# Patient Record
Sex: Female | Born: 1965 | Race: Black or African American | Hispanic: No | Marital: Married | Smoking: Never smoker
Health system: Southern US, Community
[De-identification: ages and names within clinical notes are randomized; demographics above are authoritative.]

## PROBLEM LIST (undated history)

## (undated) DIAGNOSIS — R7303 Prediabetes: Secondary | ICD-10-CM

## (undated) DIAGNOSIS — I10 Essential (primary) hypertension: Secondary | ICD-10-CM

## (undated) DIAGNOSIS — E119 Type 2 diabetes mellitus without complications: Secondary | ICD-10-CM

## (undated) DIAGNOSIS — C50919 Malignant neoplasm of unspecified site of unspecified female breast: Secondary | ICD-10-CM

## (undated) DIAGNOSIS — J302 Other seasonal allergic rhinitis: Secondary | ICD-10-CM

## (undated) DIAGNOSIS — T7840XA Allergy, unspecified, initial encounter: Secondary | ICD-10-CM

## (undated) DIAGNOSIS — M1712 Unilateral primary osteoarthritis, left knee: Secondary | ICD-10-CM

## (undated) HISTORY — DX: Malignant neoplasm of unspecified site of unspecified female breast: C50.919

## (undated) HISTORY — DX: Type 2 diabetes mellitus without complications: E11.9

## (undated) HISTORY — DX: Allergy, unspecified, initial encounter: T78.40XA

## (undated) NOTE — *Deleted (*Deleted)
PACU TO INPATIENT HANDOFF REPORT  Name/Age/Gender Harland German [place] 49 y.o. female  Code Status    Code Status Orders  (From admission, onward)         Start     Ordered   03/07/20 1703  Full code  Continuous        03/07/20 1705        Code Status History    This patient has a current code status but no historical code status.   Advance Care Planning Activity      Home/SNF/Other {Discharge Destination:18313::"Home"}  Chief Complaint Breast cancer (HCC) [C50.919]  Level of Care/Admitting Diagnosis ED Disposition    None      Medical History Past Medical History:  Diagnosis Date  . Arthritis of knee, left   . Breast cancer (HCC)   . Family history of pancreatic cancer 01/12/2020  . Hypertension   . Pre-diabetes    per patient  . Seasonal allergies     Allergies No Known Allergies  IV Location/Drains/Wounds Patient Lines/Drains/Airways Status    Active Line/Drains/Airways    Name Placement date Placement time Site Days   Closed System Drain 1 Lateral;Left;Superior Breast Bulb (JP) 19 Fr. 03/07/20  1524  Breast  less than 1   Closed System Drain 1 Right Breast Bulb (JP) 19 Fr. 03/07/20  1555  Breast  less than 1   Incision (Closed) 03/07/20 Breast Right 03/07/20  1615   less than 1   Incision (Closed) 03/07/20 Breast Left 03/07/20  1615   less than 1   Incision (Closed) 03/07/20 Axilla Right 03/07/20  1626   less than 1          Labs/Imaging Results for orders placed or performed during the hospital encounter of 03/07/20 (from the past 48 hour(s))  Glucose, capillary     Status: Abnormal   Collection Time: 03/07/20 11:17 AM  Result Value Ref Range   Glucose-Capillary 111 (H) 70 - 99 mg/dL    Comment: Glucose reference range applies only to samples taken after fasting for at least 8 hours.   Comment 1 Notify RN   Glucose, capillary     Status: Abnormal   Collection Time: 03/07/20  5:21 PM  Result Value Ref Range   Glucose-Capillary 127 (H) 70  - 99 mg/dL    Comment: Glucose reference range applies only to samples taken after fasting for at least 8 hours.   NM Sentinel Node Inj-No Rpt (Breast)  Result Date: 03/07/2020 Sulfur colloid was injected by the nuclear medicine technologist for melanoma sentinel node.    Pending Labs   Vitals/Pain Today's Vitals   03/07/20 1745 03/07/20 1800 03/07/20 1815 03/07/20 1900  BP: 120/70 121/78 127/82 120/72  Pulse: 83 74 75 70  Resp: (!) 21 19 20 18   Temp:    98.3 F (36.8 C)  TempSrc:      SpO2: 96% 96% 97% 98%  PainSc:  Asleep  3     Isolation Precautions @ISOLATION @  Administered Medications Periop Administered Meds from 03/07/2020 1103 to 03/07/2020 2037      Date/Time Order Dose Route Action Action by Comments    03/07/2020 1425 0.9 % irrigation (POUR BTL) 1,000 mL Irrigation Given Chevis Pretty III, MD expires 07/24    03/07/2020 1151 acetaminophen (TYLENOL) tablet 1,000 mg 1,000 mg Oral Given Patsi Sears, RN     03/07/2020 1428 albumin human 5 % solution 0  Intravenous Lynford Humphrey, CRNA     03/07/2020 1415 albumin  human 5 % solution   Intravenous New Bag/Given Dairl Ponder, CRNA     03/07/2020 1426 bupivacaine (PF) (MARCAINE) 0.25 % injection 16 mL Infiltration Given Chevis Pretty III, MD exp 08/2023    03/07/2020 1151 celecoxib (CELEBREX) capsule 200 mg 200 mg Oral Given Patsi Sears, RN     03/07/2020 1152 chlorhexidine (PERIDEX) 0.12 % solution 15 mL 15 mL Mouth/Throat Given Patsi Sears, RN     03/07/2020 1415 dexamethasone (DECADRON) injection 10 mg Intravenous Given Dairl Ponder, CRNA     03/07/2020 1643 fentaNYL citrate (PF) (SUBLIMAZE) injection 25 mcg Intravenous Given Dairl Ponder, CRNA     03/07/2020 1534 fentaNYL citrate (PF) (SUBLIMAZE) injection 50 mcg Intravenous Given Dairl Ponder, CRNA     03/07/2020 1526 fentaNYL citrate (PF) (SUBLIMAZE) injection 50 mcg Intravenous Given Dairl Ponder, CRNA     03/07/2020 1439 fentaNYL citrate (PF)  (SUBLIMAZE) injection 50 mcg Intravenous Given Dairl Ponder, CRNA     03/07/2020 1413 fentaNYL citrate (PF) (SUBLIMAZE) injection 50 mcg Intravenous Given Dairl Ponder, CRNA     03/07/2020 1334 fentaNYL citrate (PF) (SUBLIMAZE) injection 50 mcg Intravenous Given Dairl Ponder, CRNA     03/07/2020 1151 gabapentin (NEURONTIN) capsule 300 mg 300 mg Oral Given Patsi Sears, RN     03/07/2020 1725 HYDROmorphone (DILAUDID) injection 0.25-0.5 mg 0.5 mg Intravenous Given Wynelle Link, RN     03/07/2020 1632 ketorolac (TORADOL) 30 MG/ML injection 30 mg Intravenous Given Dairl Ponder, CRNA     03/07/2020 1659 lactated ringers infusion   Intravenous New Bag/Given Dairl Ponder, CRNA     03/07/2020 1335 lactated ringers infusion   Intravenous Restarted Dorris Singh, MD     03/07/2020 1334 lactated ringers infusion   Intravenous Coralyn Pear, MD Switch to gravity    03/07/2020 1210 lactated ringers infusion   Intravenous [address] Patsi Sears, RN     03/07/2020 1342 lidocaine 2% (20 mg/mL) 5 mL syringe 80 mg Intravenous Given Dairl Ponder, CRNA     03/07/2020 1615 lidocaine-EPINEPHrine (XYLOCAINE W/EPI) 1 %-1:100000 (with pres) injection 12 mL  Given Peggye Form, DO exp 2024    03/07/2020 1152 MEDLINE mouth rinse   Mouth Rinse See Alternative Patsi Sears, RN     03/07/2020 1334 midazolam (VERSED) injection 2 mg Intravenous Given Dairl Ponder, CRNA     03/07/2020 1632 ondansetron (ZOFRAN) injection 4 mg Intravenous Given Dairl Ponder, CRNA     03/07/2020 1650 phenylephrine (NEOSYNEPHRINE) 10-0.9 MG/250ML-% infusion 0 mcg/min Intravenous Lynford Humphrey, CRNA     03/07/2020 1534 phenylephrine (NEOSYNEPHRINE) 10-0.9 MG/250ML-% infusion 15 mcg/min Intravenous Rate/Dose Change Dairl Ponder, CRNA     03/07/2020 1509 phenylephrine (NEOSYNEPHRINE) 10-0.9 MG/250ML-% infusion 25 mcg/min Intravenous Rate/Dose Change Dairl Ponder, CRNA     03/07/2020 1503 phenylephrine  (NEOSYNEPHRINE) 10-0.9 MG/250ML-% infusion 15 mcg/min Intravenous Rate/Dose Change Dairl Ponder, CRNA     03/07/2020 1500 phenylephrine (NEOSYNEPHRINE) 10-0.9 MG/250ML-% infusion 10 mcg/min Intravenous Rate/Dose Change Dairl Ponder, CRNA     03/07/2020 1454 phenylephrine (NEOSYNEPHRINE) 10-0.9 MG/250ML-% infusion 20 mcg/min Intravenous Rate/Dose Change Dairl Ponder, CRNA     03/07/2020 1451 phenylephrine (NEOSYNEPHRINE) 10-0.9 MG/250ML-% infusion 30 mcg/min Intravenous Rate/Dose Change Dairl Ponder, CRNA     03/07/2020 1442 phenylephrine (NEOSYNEPHRINE) 10-0.9 MG/250ML-% infusion 40 mcg/min Intravenous Rate/Dose Change Dairl Ponder, CRNA     03/07/2020 1431 phenylephrine (NEOSYNEPHRINE) 10-0.9 MG/250ML-% infusion 60 mcg/min Intravenous Canceled Entry Dairl Ponder, CRNA     03/07/2020 1412 phenylephrine (NEOSYNEPHRINE) 10-0.9 MG/250ML-% infusion 50 mcg/min Intravenous Rate/Dose Change Tylene Fantasia,  Mickie Hillier, CRNA     03/07/2020 1404 phenylephrine (NEOSYNEPHRINE) 10-0.9 MG/250ML-% infusion 35 mcg/min Intravenous Rate/Dose Change Dairl Ponder, CRNA     03/07/2020 1356 phenylephrine (NEOSYNEPHRINE) 10-0.9 MG/250ML-% infusion 25 mcg/min Intravenous New Bag/Given Dairl Ponder, CRNA     03/07/2020 1342 propofol (DIPRIVAN) 10 mg/mL bolus/IV push 150 mg Intravenous Given Dairl Ponder, CRNA     03/07/2020 1343 rocuronium bromide 10 mg/mL (PF) syringe 80 mg Intravenous Given Dairl Ponder, CRNA     03/07/2020 1151 scopolamine (TRANSDERM-SCOP) 1 MG/3DAYS 1.5 mg 1.5 mg Transdermal Patch Applied Patsi Sears, RN     03/07/2020 1648 sugammadex sodium (BRIDION) injection 200 mg Intravenous Given Dairl Ponder, CRNA     03/07/2020 1414 technetium sulfur colloid (NYCOMED-Pryor) filtered injection solution 1 millicurie 1 millicurie Intradermal Contrast Given Stedge, Shanin B       Mobility {Mobility:20148}

---

## 1998-12-27 ENCOUNTER — Other Ambulatory Visit: Admission: RE | Admit: 1998-12-27 | Discharge: 1998-12-27 | Payer: Self-pay | Admitting: *Deleted

## 1999-02-27 ENCOUNTER — Encounter: Payer: Self-pay | Admitting: Family Medicine

## 1999-02-27 ENCOUNTER — Ambulatory Visit (HOSPITAL_COMMUNITY): Admission: RE | Admit: 1999-02-27 | Discharge: 1999-02-27 | Payer: Self-pay | Admitting: Family Medicine

## 2003-03-24 ENCOUNTER — Emergency Department (HOSPITAL_COMMUNITY): Admission: EM | Admit: 2003-03-24 | Discharge: 2003-03-24 | Payer: Self-pay | Admitting: Emergency Medicine

## 2003-07-17 ENCOUNTER — Other Ambulatory Visit: Admission: RE | Admit: 2003-07-17 | Discharge: 2003-07-17 | Payer: Self-pay | Admitting: Family Medicine

## 2004-07-18 ENCOUNTER — Other Ambulatory Visit: Admission: RE | Admit: 2004-07-18 | Discharge: 2004-07-18 | Payer: Self-pay | Admitting: Family Medicine

## 2005-10-24 ENCOUNTER — Other Ambulatory Visit: Admission: RE | Admit: 2005-10-24 | Discharge: 2005-10-24 | Payer: Self-pay | Admitting: Family Medicine

## 2007-03-25 ENCOUNTER — Other Ambulatory Visit: Admission: RE | Admit: 2007-03-25 | Discharge: 2007-03-25 | Payer: Self-pay | Admitting: Family Medicine

## 2008-06-23 ENCOUNTER — Other Ambulatory Visit: Admission: RE | Admit: 2008-06-23 | Discharge: 2008-06-23 | Payer: Self-pay | Admitting: Family Medicine

## 2009-01-12 ENCOUNTER — Emergency Department (HOSPITAL_COMMUNITY): Admission: EM | Admit: 2009-01-12 | Discharge: 2009-01-13 | Payer: Self-pay | Admitting: Emergency Medicine

## 2009-10-12 ENCOUNTER — Ambulatory Visit (HOSPITAL_COMMUNITY): Admission: RE | Admit: 2009-10-12 | Discharge: 2009-10-12 | Payer: Self-pay | Admitting: Surgery

## 2010-06-08 ENCOUNTER — Encounter: Payer: Self-pay | Admitting: Family Medicine

## 2010-06-10 ENCOUNTER — Encounter: Payer: Self-pay | Admitting: Family Medicine

## 2010-08-05 LAB — CBC
HCT: 35.7 % — ABNORMAL LOW (ref 36.0–46.0)
Hemoglobin: 11.7 g/dL — ABNORMAL LOW (ref 12.0–15.0)
MCHC: 32.7 g/dL (ref 30.0–36.0)
MCV: 82.2 fL (ref 78.0–100.0)
Platelets: 208 10*3/uL (ref 150–400)
RBC: 4.34 MIL/uL (ref 3.87–5.11)
RDW: 14.7 % (ref 11.5–15.5)
WBC: 6.8 10*3/uL (ref 4.0–10.5)

## 2010-08-05 LAB — BASIC METABOLIC PANEL
BUN: 11 mg/dL (ref 6–23)
CO2: 32 mEq/L (ref 19–32)
Calcium: 9.2 mg/dL (ref 8.4–10.5)
Chloride: 103 mEq/L (ref 96–112)
Creatinine, Ser: 0.78 mg/dL (ref 0.4–1.2)
GFR calc Af Amer: >60 mL/min (ref >60)
GFR calc non Af Amer: >60 mL/min (ref >60)
Glucose, Bld: 72 mg/dL (ref 70–99)
Potassium: 3.8 mEq/L (ref 3.5–5.1)
Sodium: 138 mEq/L (ref 135–145)

## 2010-08-24 LAB — URINALYSIS, ROUTINE W REFLEX MICROSCOPIC
Bilirubin Urine: NEGATIVE
Glucose, UA: NEGATIVE mg/dL
Hgb urine dipstick: NEGATIVE
Ketones, ur: NEGATIVE mg/dL
Nitrite: NEGATIVE
Protein, ur: NEGATIVE mg/dL
Specific Gravity, Urine: 1.011 (ref 1.005–1.030)
Urobilinogen, UA: 0.2 mg/dL (ref 0.0–1.0)
pH: 7.5 (ref 5.0–8.0)

## 2010-08-24 LAB — CBC
HCT: 36.4 % (ref 36.0–46.0)
Hemoglobin: 11.9 g/dL — ABNORMAL LOW (ref 12.0–15.0)
MCHC: 32.6 g/dL (ref 30.0–36.0)
MCV: 82.5 fL (ref 78.0–100.0)
Platelets: 187 10*3/uL (ref 150–400)
RBC: 4.41 MIL/uL (ref 3.87–5.11)
RDW: 15.1 % (ref 11.5–15.5)
WBC: 8.3 10*3/uL (ref 4.0–10.5)

## 2010-08-24 LAB — URINE MICROSCOPIC-ADD ON

## 2010-08-24 LAB — DIFFERENTIAL
Basophils Absolute: 0 10*3/uL (ref 0.0–0.1)
Basophils Relative: 1 % (ref 0–1)
Eosinophils Absolute: 0.1 10*3/uL (ref 0.0–0.7)
Eosinophils Relative: 1 % (ref 0–5)
Lymphocytes Relative: 29 % (ref 12–46)
Lymphs Abs: 2.4 10*3/uL (ref 0.7–4.0)
Monocytes Absolute: 0.6 10*3/uL (ref 0.1–1.0)
Monocytes Relative: 7 % (ref 3–12)
Neutro Abs: 5.2 10*3/uL (ref 1.7–7.7)
Neutrophils Relative %: 63 % (ref 43–77)

## 2010-08-24 LAB — BASIC METABOLIC PANEL
BUN: 5 mg/dL — ABNORMAL LOW (ref 6–23)
CO2: 28 mEq/L (ref 19–32)
Calcium: 8.6 mg/dL (ref 8.4–10.5)
Chloride: 104 mEq/L (ref 96–112)
Creatinine, Ser: 0.75 mg/dL (ref 0.4–1.2)
GFR calc Af Amer: >60 mL/min (ref >60)
GFR calc non Af Amer: >60 mL/min (ref >60)
Glucose, Bld: 123 mg/dL — ABNORMAL HIGH (ref 70–99)
Potassium: 3.6 mEq/L (ref 3.5–5.1)
Sodium: 136 mEq/L (ref 135–145)

## 2010-08-24 LAB — LIPASE, BLOOD: Lipase: 18 U/L (ref 11–59)

## 2010-08-24 LAB — POCT PREGNANCY, URINE: Preg Test, Ur: NEGATIVE

## 2012-02-17 ENCOUNTER — Other Ambulatory Visit (HOSPITAL_COMMUNITY)
Admission: RE | Admit: 2012-02-17 | Discharge: 2012-02-17 | Disposition: A | Discharge status: Home / Self Care | Payer: BC Managed Care – PPO | Source: Ambulatory Visit | Attending: Family Medicine | Admitting: Family Medicine

## 2012-02-17 ENCOUNTER — Other Ambulatory Visit: Payer: Self-pay | Admitting: Family Medicine

## 2012-02-17 DIAGNOSIS — Z0000 Encounter for general adult medical examination without abnormal findings: Secondary | ICD-10-CM | POA: Insufficient documentation

## 2012-10-05 ENCOUNTER — Other Ambulatory Visit: Payer: Self-pay | Admitting: Neurosurgery

## 2012-10-05 DIAGNOSIS — M549 Dorsalgia, unspecified: Secondary | ICD-10-CM

## 2012-10-06 ENCOUNTER — Ambulatory Visit
Admission: RE | Admit: 2012-10-06 | Discharge: 2012-10-06 | Disposition: A | Discharge status: Home / Self Care | Payer: BC Managed Care – PPO | Source: Ambulatory Visit | Attending: Neurosurgery | Admitting: Neurosurgery

## 2012-10-06 ENCOUNTER — Other Ambulatory Visit: Payer: Self-pay | Admitting: Neurosurgery

## 2012-10-06 VITALS — BP 112/75 | HR 74 | Ht 64.0 in | Wt 200.0 lb

## 2012-10-06 DIAGNOSIS — M549 Dorsalgia, unspecified: Secondary | ICD-10-CM

## 2012-10-06 MED ORDER — METHYLPREDNISOLONE ACETATE 40 MG/ML INJ SUSP (RADIOLOG
120.0000 mg | Freq: Once | INTRAMUSCULAR | Status: AC
  Administered 2012-10-06: 120 mg via EPIDURAL

## 2012-10-06 MED ORDER — IOHEXOL 180 MG/ML  SOLN
1.0000 mL | Freq: Once | INTRAMUSCULAR | Status: AC | PRN
  Administered 2012-10-06: 1 mL via EPIDURAL

## 2012-10-15 ENCOUNTER — Other Ambulatory Visit: Payer: Self-pay | Admitting: Neurosurgery

## 2012-10-15 DIAGNOSIS — M5416 Radiculopathy, lumbar region: Secondary | ICD-10-CM

## 2012-10-20 ENCOUNTER — Other Ambulatory Visit: Payer: Self-pay | Admitting: Neurosurgery

## 2012-10-20 ENCOUNTER — Ambulatory Visit
Admission: RE | Admit: 2012-10-20 | Discharge: 2012-10-20 | Disposition: A | Discharge status: Home / Self Care | Payer: BC Managed Care – PPO | Source: Ambulatory Visit | Attending: Neurosurgery | Admitting: Neurosurgery

## 2012-10-20 VITALS — BP 106/77 | HR 71

## 2012-10-20 DIAGNOSIS — M5416 Radiculopathy, lumbar region: Secondary | ICD-10-CM

## 2012-10-20 MED ORDER — METHYLPREDNISOLONE ACETATE 40 MG/ML INJ SUSP (RADIOLOG
120.0000 mg | Freq: Once | INTRAMUSCULAR | Status: AC
  Administered 2012-10-20: 120 mg via EPIDURAL

## 2012-10-20 MED ORDER — IOHEXOL 180 MG/ML  SOLN
1.0000 mL | Freq: Once | INTRAMUSCULAR | Status: AC | PRN
  Administered 2012-10-20: 1 mL via EPIDURAL

## 2013-09-07 ENCOUNTER — Encounter (HOSPITAL_COMMUNITY): Payer: Self-pay | Admitting: Emergency Medicine

## 2013-09-07 ENCOUNTER — Emergency Department (HOSPITAL_COMMUNITY): Payer: BC Managed Care – PPO

## 2013-09-07 ENCOUNTER — Emergency Department (HOSPITAL_COMMUNITY)
Admission: EM | Admit: 2013-09-07 | Discharge: 2013-09-07 | Disposition: A | Discharge status: Home / Self Care | Payer: BC Managed Care – PPO | Attending: Emergency Medicine | Admitting: Emergency Medicine

## 2013-09-07 DIAGNOSIS — R0789 Other chest pain: Secondary | ICD-10-CM

## 2013-09-07 DIAGNOSIS — I10 Essential (primary) hypertension: Secondary | ICD-10-CM | POA: Insufficient documentation

## 2013-09-07 DIAGNOSIS — R0602 Shortness of breath: Secondary | ICD-10-CM | POA: Insufficient documentation

## 2013-09-07 DIAGNOSIS — R071 Chest pain on breathing: Secondary | ICD-10-CM | POA: Insufficient documentation

## 2013-09-07 DIAGNOSIS — Z79899 Other long term (current) drug therapy: Secondary | ICD-10-CM | POA: Insufficient documentation

## 2013-09-07 HISTORY — DX: Other seasonal allergic rhinitis: J30.2

## 2013-09-07 HISTORY — DX: Essential (primary) hypertension: I10

## 2013-09-07 LAB — COMPREHENSIVE METABOLIC PANEL
ALT: 21 U/L (ref 0–35)
AST: 24 U/L (ref 0–37)
Albumin: 3.9 g/dL (ref 3.5–5.2)
Alkaline Phosphatase: 61 U/L (ref 39–117)
BUN: 9 mg/dL (ref 6–23)
CO2: 26 mEq/L (ref 19–32)
Calcium: 9.5 mg/dL (ref 8.4–10.5)
Chloride: 102 mEq/L (ref 96–112)
Creatinine, Ser: 0.72 mg/dL (ref 0.50–1.10)
GFR calc Af Amer: >90 mL/min (ref >90)
GFR calc non Af Amer: >90 mL/min (ref >90)
Glucose, Bld: 81 mg/dL (ref 70–99)
Potassium: 3.3 mEq/L — ABNORMAL LOW (ref 3.7–5.3)
Sodium: 142 mEq/L (ref 137–147)
Total Bilirubin: 0.2 mg/dL — ABNORMAL LOW (ref 0.3–1.2)
Total Protein: 7.7 g/dL (ref 6.0–8.3)

## 2013-09-07 LAB — CBC WITH DIFFERENTIAL/PLATELET
Basophils Absolute: 0.1 10*3/uL (ref 0.0–0.1)
Basophils Relative: 1 % (ref 0–1)
Eosinophils Absolute: 0.4 10*3/uL (ref 0.0–0.7)
Eosinophils Relative: 5 % (ref 0–5)
HCT: 38.8 % (ref 36.0–46.0)
Hemoglobin: 12.5 g/dL (ref 12.0–15.0)
Lymphocytes Relative: 49 % — ABNORMAL HIGH (ref 12–46)
Lymphs Abs: 3.3 10*3/uL (ref 0.7–4.0)
MCH: 26.4 pg (ref 26.0–34.0)
MCHC: 32.2 g/dL (ref 30.0–36.0)
MCV: 81.9 fL (ref 78.0–100.0)
Monocytes Absolute: 0.4 10*3/uL (ref 0.1–1.0)
Monocytes Relative: 7 % (ref 3–12)
Neutro Abs: 2.5 10*3/uL (ref 1.7–7.7)
Neutrophils Relative %: 38 % — ABNORMAL LOW (ref 43–77)
Platelets: 236 10*3/uL (ref 150–400)
RBC: 4.74 MIL/uL (ref 3.87–5.11)
RDW: 14.9 % (ref 11.5–15.5)
WBC: 6.7 10*3/uL (ref 4.0–10.5)

## 2013-09-07 LAB — I-STAT TROPONIN, ED: Troponin i, poc: 0 ng/mL (ref 0.00–0.08)

## 2013-09-07 MED ORDER — IBUPROFEN 800 MG PO TABS: 800.0000 mg | ORAL_TABLET | Freq: Three times a day (TID) | ORAL | Status: DC

## 2013-09-07 MED ORDER — KETOROLAC TROMETHAMINE 60 MG/2ML IM SOLN
60.0000 mg | Freq: Once | INTRAMUSCULAR | Status: AC
  Administered 2013-09-07: 60 mg via INTRAMUSCULAR
  Filled 2013-09-07: qty 2

## 2013-09-07 NOTE — Discharge Instructions (Signed)
Chest Wall Pain °Chest wall pain is pain in or around the bones and muscles of your chest. It may take up to 6 weeks to get better. It may take longer if you must stay physically active in your work and activities.  °CAUSES  °Chest wall pain may happen on its own. However, it may be caused by: °· A viral illness like the flu. °· Injury. °· Coughing. °· Exercise. °· Arthritis. °· Fibromyalgia. °· Shingles. °HOME CARE INSTRUCTIONS  °· Avoid overtiring physical activity. Try not to strain or perform activities that cause pain. This includes any activities using your chest or your abdominal and side muscles, especially if heavy weights are used. °· Put ice on the sore area. °· Put ice in a plastic bag. °· Place a towel between your skin and the bag. °· Leave the ice on for 15-20 minutes per hour while awake for the first 2 days. °· Only take over-the-counter or prescription medicines for pain, discomfort, or fever as directed by your caregiver. °SEEK IMMEDIATE MEDICAL CARE IF:  °· Your pain increases, or you are very uncomfortable. °· You have a fever. °· Your chest pain becomes worse. °· You have new, unexplained symptoms. °· You have nausea or vomiting. °· You feel sweaty or lightheaded. °· You have a cough with phlegm (sputum), or you cough up blood. °MAKE SURE YOU:  °· Understand these instructions. °· Will watch your condition. °· Will get help right away if you are not doing well or get worse. °Document Released: 05/05/2005 Document Revised: 07/28/2011 Document Reviewed: 12/30/2010 °ExitCare® Patient Information ©2014 [employer]. ° °Chest Pain (Nonspecific) °It is often hard to give a specific diagnosis for the cause of chest pain. There is always a chance that your pain could be related to something serious, such as a heart attack or a blood clot in the lungs. You need to follow up with your caregiver for further evaluation. °CAUSES  °· Heartburn. °· Pneumonia or bronchitis. °· Anxiety or  stress. °· Inflammation around your heart (pericarditis) or lung (pleuritis or pleurisy). °· A blood clot in the lung. °· A collapsed lung (pneumothorax). It can develop suddenly on its own (spontaneous pneumothorax) or from injury (trauma) to the chest. °· Shingles infection (herpes zoster virus). °The chest wall is composed of bones, muscles, and cartilage. Any of these can be the source of the pain. °· The bones can be bruised by injury. °· The muscles or cartilage can be strained by coughing or overwork. °· The cartilage can be affected by inflammation and become sore (costochondritis). °DIAGNOSIS  °Lab tests or other studies, such as X-rays, electrocardiography, stress testing, or cardiac imaging, may be needed to find the cause of your pain.  °TREATMENT  °· Treatment depends on what may be causing your chest pain. Treatment may include: °· Acid blockers for heartburn. °· Anti-inflammatory medicine. °· Pain medicine for inflammatory conditions. °· Antibiotics if an infection is present. °· You may be advised to change lifestyle habits. This includes stopping smoking and avoiding alcohol, caffeine, and chocolate. °· You may be advised to keep your head raised (elevated) when sleeping. This reduces the chance of acid going backward from your stomach into your esophagus. °· Most of the time, nonspecific chest pain will improve within 2 to 3 days with rest and mild pain medicine. °HOME CARE INSTRUCTIONS  °· If antibiotics were prescribed, take your antibiotics as directed. Finish them even if you start to feel better. °· For the next few   days, avoid physical activities that bring on chest pain. Continue physical activities as directed. °· Do not smoke. °· Avoid drinking alcohol. °· Only take over-the-counter or prescription medicine for pain, discomfort, or fever as directed by your caregiver. °· Follow your caregiver's suggestions for further testing if your chest pain does not go away. °· Keep any follow-up  appointments you made. If you do not go to an appointment, you could develop lasting (chronic) problems with pain. If there is any problem keeping an appointment, you must call to reschedule. °SEEK MEDICAL CARE IF:  °· You think you are having problems from the medicine you are taking. Read your medicine instructions carefully. °· Your chest pain does not go away, even after treatment. °· You develop a rash with blisters on your chest. °SEEK IMMEDIATE MEDICAL CARE IF:  °· You have increased chest pain or pain that spreads to your arm, neck, jaw, back, or abdomen. °· You develop shortness of breath, an increasing cough, or you are coughing up blood. °· You have severe back or abdominal pain, feel nauseous, or vomit. °· You develop severe weakness, fainting, or chills. °· You have a fever. °THIS IS AN EMERGENCY. Do not wait to see if the pain will go away. Get medical help at once. Call your local emergency services (911 in [place] Do not drive yourself to the hospital. °MAKE SURE YOU:  °· Understand these instructions. °· Will watch your condition. °· Will get help right away if you are not doing well or get worse. °Document Released: 02/12/2005 Document Revised: 07/28/2011 Document Reviewed: 12/09/2007 °ExitCare® Patient Information ©2014 [employer]. ° °

## 2013-09-07 NOTE — ED Notes (Signed)
Patient transported to X-ray 

## 2013-09-07 NOTE — ED Notes (Signed)
Pt presents to department for evaluation of L sided chest pain and SOB. Onset this morning. 9/10 pain, becomes worse with deep breathing and movement. History of pleurisy. Respirations unlabored. Speaking complete sentences. Pt is alert and oriented x4.

## 2013-09-07 NOTE — ED Provider Notes (Signed)
CSN: [id number]     Arrival date & time 09/07/13  1421 History   First MD Initiated Contact with Patient 09/07/13 1505     Chief Complaint  Patient presents with  . Chest Pain  . Shortness of Breath     (Consider location/radiation/quality/duration/timing/severity/associated sxs/prior Treatment) HPI Comments: Pt is a 48 y/o female with a PMHx of HTN and seasonal allergies who presents to the ED complaining of sudden onset L sided chest pain beginning earlier this morning while sitting at her desk at work. Pain has been constant since, described as sharp, non-radiating, worse with certain movements and deep inspiration rated 9/10. She has not tried any alleviating factors. Admits to associated shortness of breath with certain movements and deep inspiration due to pain. States she has a history of pleurisy and this feels similar. Denies calf pain or swelling, fever, chills, cough, n/v, diaphoresis. No family hx of early heart disease. No hx of blood clots. Non-smoker. No exogenous estrogen or recent surgeries.  Patient is a 48 y.o. female presenting with chest pain and shortness of breath.  Chest Pain Associated symptoms: shortness of breath   Shortness of Breath Associated symptoms: chest pain     Past Medical History  Diagnosis Date  . Hypertension   . Seasonal allergies    History reviewed. No pertinent past surgical history. No family history on file. History  Substance Use Topics  . Smoking status: Never Smoker   . Smokeless tobacco: Never Used  . Alcohol Use: No   OB History   Grav Para Term Preterm Abortions TAB SAB Ect Mult Living                 Review of Systems  Respiratory: Positive for shortness of breath.   Cardiovascular: Positive for chest pain.  All other systems reviewed and are negative.     Allergies  Review of patient's allergies indicates no known allergies.  Home Medications   Prior to Admission medications   Medication Sig Start Date End Date  Taking? Authorizing Provider  cetirizine (ZYRTEC) 10 MG tablet Take 10 mg by mouth daily.   Yes Historical Provider, MD  cholecalciferol (VITAMIN D) 1000 UNITS tablet Take 1,000 Units by mouth daily.   Yes Historical Provider, MD  lisinopril (PRINIVIL,ZESTRIL) 20 MG tablet Take 20 mg by mouth daily.   Yes Historical Provider, MD  Multiple Vitamins-Minerals (MULTIVITAMIN WITH MINERALS) tablet Take 1 tablet by mouth daily.   Yes Historical Provider, MD   BP 110/53  Pulse 65  Temp(Src) 98.3 F (36.8 C) (Oral)  SpO2 100% Physical Exam  Nursing note and vitals reviewed. Constitutional: She is oriented to person, place, and time. She appears well-developed and well-nourished. No distress.  HENT:  Head: Normocephalic and atraumatic.  Mouth/Throat: Oropharynx is clear and moist.  Eyes: Conjunctivae and EOM are normal. Pupils are equal, round, and reactive to light.  Neck: Normal range of motion. Neck supple. No JVD present.  Cardiovascular: Normal rate, regular rhythm, normal heart sounds and intact distal pulses.   No extremity edema.  Pulmonary/Chest: Effort normal and breath sounds normal. No respiratory distress. She exhibits tenderness.    Chest wall tenderness described as same pain she is experiencing.  Abdominal: Soft. Bowel sounds are normal. There is no tenderness.  Musculoskeletal: Normal range of motion. She exhibits no edema.  Neurological: She is alert and oriented to person, place, and time. She has normal strength. No sensory deficit.  Speech fluent, goal oriented. Moves limbs without ataxia.  Equal grip strength bilateral.  Skin: Skin is warm and dry. She is not diaphoretic.  Psychiatric: She has a normal mood and affect. Her behavior is normal.    ED Course  Procedures (including critical care time) Labs Review Labs Reviewed  CBC WITH DIFFERENTIAL - Abnormal; Notable for the following:    Neutrophils Relative % 38 (*)    Lymphocytes Relative 49 (*)    All other  components within normal limits  COMPREHENSIVE METABOLIC PANEL - Abnormal; Notable for the following:    Potassium 3.3 (*)    Total Bilirubin 0.2 (*)    All other components within normal limits  I-STAT TROPOININ, ED    Imaging Review Dg Chest 2 View  09/07/2013   CLINICAL DATA:  Left chest pain, shortness of breath, hypertension, history smoking  EXAM: CHEST  2 VIEW  COMPARISON:  10/10/2009  FINDINGS: Borderline enlargement of cardiac silhouette.  Mediastinal contours and pulmonary vascularity normal.  Lungs clear.  Minimal central peribronchial thickening.  No pleural effusion or pneumothorax.  Bones unremarkable.  IMPRESSION: Minimal chronic peribronchial thickening without infiltrate.   Electronically Signed   By: Lavonia Dana M.D.   On: 09/07/2013 15:29     EKG Interpretation   Date/Time:  Wednesday September 07 2013 14:26:46 EDT Ventricular Rate:  67 PR Interval:  158 QRS Duration: 106 QT Interval:  440 QTC Calculation: 464 R Axis:   59 Text Interpretation:  Normal sinus rhythm Incomplete left bundle branch  block Borderline ECG No significant change since last tracing Confirmed by  SHELDON  MD, Juanda Crumble [phone]) on 09/07/2013 3:07:13 PM      MDM   Final diagnoses:  Chest wall pain   Pt presenting with reproducible chest pain. She is well appearing and in no apparent distress. Labs obtained in triage prior to patient being seen, mild hypokalemia 3.3, otherwise normal. Troponin negative. CXR showing minimal chronic peribronchial thickening without infiltrate. No cough. Doubt PE or cardiac in nature. Most likely musculoskeletal. PERC negative, low risk HEART score 2.  4:54 PM Pt reports her pain has started to improve with toradol. Chest is less tender than initially. She is stable for d/c. F/u with PCP. Return precautions given. Patient states understanding of treatment care plan and is agreeable.   Illene Labrador, PA-C 09/07/13 1654

## 2013-09-07 NOTE — ED Provider Notes (Signed)
Medical screening examination/treatment/procedure(s) were performed by non-physician practitioner and as supervising physician I was immediately available for consultation/collaboration.   EKG Interpretation   Date/Time:  Wednesday September 07 2013 14:26:46 EDT Ventricular Rate:  67 PR Interval:  158 QRS Duration: 106 QT Interval:  440 QTC Calculation: 464 R Axis:   59 Text Interpretation:  Normal sinus rhythm Incomplete left bundle branch  block Borderline ECG No significant change since last tracing Confirmed by  [facility]  MD, CHARLES [phone]) on 09/07/2013 3:07:13 PM        Juanda Crumble B. Karle Starch, MD 09/07/13 [phone]

## 2014-03-07 ENCOUNTER — Encounter: Payer: BC Managed Care – PPO | Attending: Family Medicine

## 2014-03-07 VITALS — Ht 64.0 in | Wt 211.0 lb

## 2014-03-07 DIAGNOSIS — E119 Type 2 diabetes mellitus without complications: Secondary | ICD-10-CM | POA: Insufficient documentation

## 2014-03-07 DIAGNOSIS — Z713 Dietary counseling and surveillance: Secondary | ICD-10-CM | POA: Diagnosis not present

## 2014-03-07 NOTE — Progress Notes (Signed)

## 2014-03-14 DIAGNOSIS — E119 Type 2 diabetes mellitus without complications: Secondary | ICD-10-CM

## 2014-03-15 NOTE — Progress Notes (Signed)

## 2014-03-18 ENCOUNTER — Ambulatory Visit (INDEPENDENT_AMBULATORY_CARE_PROVIDER_SITE_OTHER): Payer: BC Managed Care – PPO | Admitting: Emergency Medicine

## 2014-03-18 ENCOUNTER — Telehealth: Payer: Self-pay

## 2014-03-18 VITALS — BP 132/80 | HR 72 | Temp 98.4°F | Resp 18 | Ht 64.5 in | Wt 207.0 lb

## 2014-03-18 DIAGNOSIS — A5901 Trichomonal vulvovaginitis: Secondary | ICD-10-CM

## 2014-03-18 DIAGNOSIS — N898 Other specified noninflammatory disorders of vagina: Secondary | ICD-10-CM

## 2014-03-18 LAB — POCT WET PREP WITH KOH
Clue Cells Wet Prep HPF POC: NEGATIVE
KOH Prep POC: NEGATIVE
RBC Wet Prep HPF POC: NEGATIVE
Trichomonas, UA: POSITIVE
Yeast Wet Prep HPF POC: NEGATIVE

## 2014-03-18 MED ORDER — METRONIDAZOLE 500 MG PO TABS: 500.0000 mg | ORAL_TABLET | Freq: Once | ORAL | Status: DC

## 2014-03-18 NOTE — Progress Notes (Signed)
[facility] [address] [place] [address] [phone]  Date:  03/18/2014   Name:  Ariel Andersen   DOB:  1965/08/10   MRN:  [id number]  PCP:  Vidal Schwalbe, MD    Chief Complaint: Vaginal Discharge   History of Present Illness:  Ariel Andersen is a 48 y.o. very pleasant female patient who presents with the following:  Patient has a two week history of vaginal discharge that requires her to wear a panty liner No dysuria or itching.  No fever or chills Has used monistat and diflucan with no improvement Not pregnant No improvement with over the counter medications or other home remedies.  Denies other complaint or health concern today.   There are no active problems to display for this patient.   Past Medical History  Diagnosis Date  . Hypertension   . Seasonal allergies   . Diabetes mellitus without complication   . Allergy     History reviewed. No pertinent past surgical history.  History  Substance Use Topics  . Smoking status: Never Smoker   . Smokeless tobacco: Never Used  . Alcohol Use: No    Family History  Problem Relation Age of Onset  . Diabetes Mother   . Hyperlipidemia Mother   . Hypertension Mother   . Diabetes Father   . Hyperlipidemia Father   . Hypertension Father   . Stroke Father     No Known Allergies  Medication list has been reviewed and updated.  Current Outpatient Prescriptions on File Prior to Visit  Medication Sig Dispense Refill  . cholecalciferol (VITAMIN D) 1000 UNITS tablet Take 1,000 Units by mouth daily.      Marland Kitchen lisinopril (PRINIVIL,ZESTRIL) 20 MG tablet Take 20 mg by mouth daily.      . Multiple Vitamins-Minerals (MULTIVITAMIN WITH MINERALS) tablet Take 1 tablet by mouth daily.       No current facility-administered medications on file prior to visit.    Review of Systems:  As per HPI, otherwise negative.    Physical Examination: Filed Vitals:   03/18/14 1537  BP: 132/80   Pulse: 72  Temp: 98.4 F (36.9 C)  Resp: 18   Filed Vitals:   03/18/14 1537  Height: 5' 4.5" (1.638 m)  Weight: 207 lb (93.895 kg)   Body mass index is 35 kg/(m^2). Ideal Body Weight: Weight in (lb) to have BMI = 25: 147.6   GEN: WDWN, NAD, Non-toxic, Alert & Oriented x 3 HEENT: Atraumatic, Normocephalic.  Ears and Nose: No external deformity. EXTR: No clubbing/cyanosis/edema NEURO: Normal gait.  PSYCH: Normally interactive. Conversant. Not depressed or anxious appearing.  Calm demeanor.    Assessment and Plan: Trichomonas Flagyl  Signed,  Ellison Carwin, MD   Results for orders placed in visit on 03/18/14  POCT WET PREP WITH KOH      Result Value Ref Range   Trichomonas, UA Positive     Clue Cells Wet Prep HPF POC neg     Epithelial Wet Prep HPF POC 1-6     Yeast Wet Prep HPF POC neg     Bacteria Wet Prep HPF POC 2+     RBC Wet Prep HPF POC neg     WBC Wet Prep HPF POC 8-10     KOH Prep POC Negative

## 2014-03-18 NOTE — Patient Instructions (Signed)

## 2014-03-18 NOTE — Telephone Encounter (Signed)
Pharm called. Metronidazole # 8 was sent into pharm with sig to take once. Per Dr. Ouida Sills, he instructed pt to take 4 tabs and for her husband to take 4 tabs. Per Dr. Loni Muse, told pharm to write take as directed.

## 2014-03-21 ENCOUNTER — Encounter: Payer: BC Managed Care – PPO | Attending: Family Medicine

## 2014-03-21 DIAGNOSIS — Z713 Dietary counseling and surveillance: Secondary | ICD-10-CM | POA: Diagnosis not present

## 2014-03-21 DIAGNOSIS — E119 Type 2 diabetes mellitus without complications: Secondary | ICD-10-CM | POA: Insufficient documentation

## 2014-03-21 NOTE — Progress Notes (Signed)
Patient was seen on 03/21/14 for the third of a series of three diabetes self-management courses at the [facility]. The following learning objectives were met by the patient during this class:  . State the amount of activity recommended for healthy living . Describe activities suitable for individual needs . Identify ways to regularly incorporate activity into daily life . Identify barriers to activity and ways to over come these barriers  Identify diabetes medications being personally used and their primary action for lowering glucose and possible side effects . Describe role of stress on blood glucose and develop strategies to address psychosocial issues . Identify diabetes complications and ways to prevent them  Explain how to manage diabetes during illness . Evaluate success in meeting personal goal . Establish 2-3 goals that they will plan to diligently work on until they return for the  37-monthfollow-up visit  Goals:   I will be active 10 minutes or more 3 times a week  Your patient has identified these potential barriers to change:  Motivation  Your patient has identified their diabetes self-care support plan as  Family Support Plan:  Attend Core 4 in 4 months

## 2014-07-20 ENCOUNTER — Other Ambulatory Visit (HOSPITAL_COMMUNITY)
Admission: RE | Admit: 2014-07-20 | Discharge: 2014-07-20 | Disposition: A | Discharge status: Home / Self Care | Payer: BC Managed Care – PPO | Source: Ambulatory Visit | Attending: Family Medicine | Admitting: Family Medicine

## 2014-07-20 ENCOUNTER — Other Ambulatory Visit: Payer: Self-pay | Admitting: Family Medicine

## 2014-07-20 DIAGNOSIS — Z124 Encounter for screening for malignant neoplasm of cervix: Secondary | ICD-10-CM | POA: Insufficient documentation

## 2014-07-21 LAB — CYTOLOGY - PAP

## 2015-03-22 ENCOUNTER — Encounter (HOSPITAL_COMMUNITY): Payer: Self-pay | Admitting: *Deleted

## 2015-03-22 ENCOUNTER — Emergency Department (HOSPITAL_COMMUNITY): Payer: BC Managed Care – PPO

## 2015-03-22 ENCOUNTER — Emergency Department (HOSPITAL_COMMUNITY)
Admission: EM | Admit: 2015-03-22 | Discharge: 2015-03-22 | Disposition: A | Discharge status: Home / Self Care | Payer: BC Managed Care – PPO | Attending: Emergency Medicine | Admitting: Emergency Medicine

## 2015-03-22 DIAGNOSIS — M4806 Spinal stenosis, lumbar region: Secondary | ICD-10-CM | POA: Diagnosis not present

## 2015-03-22 DIAGNOSIS — M519 Unspecified thoracic, thoracolumbar and lumbosacral intervertebral disc disorder: Secondary | ICD-10-CM | POA: Insufficient documentation

## 2015-03-22 DIAGNOSIS — M5136 Other intervertebral disc degeneration, lumbar region: Secondary | ICD-10-CM

## 2015-03-22 DIAGNOSIS — E119 Type 2 diabetes mellitus without complications: Secondary | ICD-10-CM | POA: Insufficient documentation

## 2015-03-22 DIAGNOSIS — M545 Low back pain: Secondary | ICD-10-CM | POA: Diagnosis present

## 2015-03-22 DIAGNOSIS — Z79899 Other long term (current) drug therapy: Secondary | ICD-10-CM | POA: Diagnosis not present

## 2015-03-22 DIAGNOSIS — I10 Essential (primary) hypertension: Secondary | ICD-10-CM | POA: Diagnosis not present

## 2015-03-22 DIAGNOSIS — M5432 Sciatica, left side: Secondary | ICD-10-CM | POA: Insufficient documentation

## 2015-03-22 DIAGNOSIS — M48061 Spinal stenosis, lumbar region without neurogenic claudication: Secondary | ICD-10-CM

## 2015-03-22 DIAGNOSIS — M5126 Other intervertebral disc displacement, lumbar region: Secondary | ICD-10-CM

## 2015-03-22 MED ORDER — ONDANSETRON 4 MG PO TBDP
4.0000 mg | ORAL_TABLET | Freq: Once | ORAL | Status: AC
  Administered 2015-03-22: 4 mg via ORAL
  Filled 2015-03-22: qty 1

## 2015-03-22 MED ORDER — KETOROLAC TROMETHAMINE 60 MG/2ML IM SOLN
60.0000 mg | Freq: Once | INTRAMUSCULAR | Status: AC
  Administered 2015-03-22: 60 mg via INTRAMUSCULAR
  Filled 2015-03-22: qty 2

## 2015-03-22 MED ORDER — HYDROMORPHONE HCL 1 MG/ML IJ SOLN
2.0000 mg | Freq: Once | INTRAMUSCULAR | Status: AC
  Administered 2015-03-22: 2 mg via INTRAMUSCULAR
  Filled 2015-03-22: qty 2

## 2015-03-22 MED ORDER — DEXAMETHASONE SODIUM PHOSPHATE 10 MG/ML IJ SOLN
10.0000 mg | Freq: Once | INTRAMUSCULAR | Status: AC
  Administered 2015-03-22: 10 mg via INTRAMUSCULAR
  Filled 2015-03-22: qty 1

## 2015-03-22 NOTE — ED Notes (Addendum)
Patient reports left sided lower back pain radiating down left leg into toes, pain reports hx of the same due to ruptured disc. Patient was seen by a doctor by Monday and given rx for prednisone, muscle relaxer, and narcotic.

## 2015-03-22 NOTE — ED Provider Notes (Signed)
CSN: [id number]     Arrival date & time 03/22/15  1123 History  By signing my name below, I, Ariel Andersen, attest that this documentation has been prepared under the direction and in the presence of Ariel Mail, PA-C.  Electronically Signed: Eustaquio Andersen, ED Scribe. 03/22/2015. 3:05 PM.  Chief Complaint  Patient presents with  . Back Pain   The history is provided by the patient. No language interpreter was used.     HPI Comments: Ariel Andersen is a 49 y.o. female who presents to the Emergency Department complaining of sudden onset, intermittent, severe, lower back pain radiating into left buttocks x 4 days, worsening today. Pt states that she was getting out of bed to brush her teeth this morning when she had worsening shooting pain that has been constant since. Pt also complains of numbness and paresthesia down her left leg posteriorly. Pt was seen by [facility] 3 days ago for same symptoms. She was given prescriptions for Prednisone, muscle relaxer, and narcotics. The physician also called Neurosurgeon, Dr. Hal Neer, to make an appointment for pt for tomorrow. Pt has previously seen Dr. Hal Neer approximately 2 years ago for similar symptoms. She had 2 epidural cortisone injections at that time with relief from the pain until it returned 4 days ago. She denies numbness or tingling in groin, urinary or bowel incontinence, or any other associated symptoms. Prior to being seen today, pt got a call from Dr. [name] office stating that they could see her tomorrow. She did not keep the appointment because she was under the impression that she could receive an epidural injection in the ED today.    Past Medical History  Diagnosis Date  . Hypertension   . Seasonal allergies   . Diabetes mellitus without complication ([place])   . Allergy    History reviewed. No pertinent past surgical history. Family History  Problem Relation Age of Onset  . Diabetes Mother   . Hyperlipidemia  Mother   . Hypertension Mother   . Diabetes Father   . Hyperlipidemia Father   . Hypertension Father   . Stroke Father    Social History  Substance Use Topics  . Smoking status: Never Smoker   . Smokeless tobacco: Never Used  . Alcohol Use: No   OB History    No data available     Review of Systems  Gastrointestinal:       Negative for bowel incontinence  Genitourinary:       Negative for urinary incontinence Negative for saddle anesthesia  Musculoskeletal: Positive for back pain and arthralgias (Left buttock).  Neurological: Positive for numbness (Posterior left leg). Negative for weakness.       + Paresthesia to left leg    Allergies  Review of patient's allergies indicates no known allergies.  Home Medications   Prior to Admission medications   Medication Sig Start Date End Date Taking? Authorizing Provider  cholecalciferol (VITAMIN D) 1000 UNITS tablet Take 1,000 Units by mouth daily.   Yes Historical Provider, MD  cyclobenzaprine (FLEXERIL) 10 MG tablet Take 10 mg by mouth 3 (three) times daily as needed for muscle spasms.   Yes Historical Provider, MD  HYDROcodone-acetaminophen (NORCO/VICODIN) 5-325 MG tablet Take 1 tablet by mouth every 6 (six) hours as needed for moderate pain.   Yes Historical Provider, MD  lisinopril-hydrochlorothiazide (PRINZIDE,ZESTORETIC) 20-12.5 MG tablet Take 1 tablet by mouth daily.   Yes Historical Provider, MD  predniSONE (DELTASONE) 20 MG tablet Take 60 mg by  mouth daily with breakfast. For 3 days. On a taper dose   Yes Historical Provider, MD  metroNIDAZOLE (FLAGYL) 500 MG tablet Take 1 tablet (500 mg total) by mouth once. 03/18/14   [name], MD   Triage VItals: BP 120/60 mmHg  Pulse 67  Temp(Src) 97.9 F (36.6 C) (Oral)  Resp 14  SpO2 93%   Physical Exam  Constitutional: She is oriented to person, place, and time. She appears well-developed and well-nourished. No distress.  HENT:  Head: Normocephalic and atraumatic.   Eyes: Conjunctivae and EOM are normal.  Neck: Neck supple. No tracheal deviation present.  Cardiovascular: Normal rate.   Pulmonary/Chest: Effort normal. No respiratory distress.  Musculoskeletal: Normal range of motion.  Positive straight leg raise on left with minimal elevation of the left heal  DTRs are normal Bilateral pulses and equal temperature in both feet Subjective numbness on the left and paresthesia  Neurological: She is alert and oriented to person, place, and time.  Skin: Skin is warm and dry.  Psychiatric: She has a normal mood and affect. Her behavior is normal.  Nursing note and vitals reviewed.   ED Course  Procedures (including critical care time)  DIAGNOSTIC STUDIES: Oxygen Saturation is 93% on RA, adequate by my interpretation.    COORDINATION OF CARE: 2:53 PM-Discussed treatment plan which includes Dilaudid injection, IM Toradol and Decadron, Zofran ODT, and calling Dr. [name] office to schedule appointment with pt at bedside and pt agreed to plan.   2:58 PM - Spoke with nurse at Dr. [name] office who will see pt in office tomorrow morning at 10:30 AM. Per nurse, request lumbar MRI.   3:04 PM - Discussed fact that pt's blood sugars will elevate but she is a fairly well controlled diabetic. Last A1C was 6.5, normal sugars range from 100-120.   Labs Review Labs Reviewed - No data to display  Imaging Review Mr Lumbar Spine Wo Contrast  03/22/2015  CLINICAL DATA:  Severe left-sided sciatica with numbness. Sudden onset of severe intermittent low back pain extending into the left buttocks over the last 4 days. Symptoms are worse today. Numbness and paresthesia is along the posterior aspect of the left lower extremity. EXAM: MRI LUMBAR SPINE WITHOUT CONTRAST TECHNIQUE: Multiplanar, multisequence MR imaging of the lumbar spine was performed. No intravenous contrast was administered. COMPARISON:  MRI of the lumbar spine from [facility]  09/29/2012. FINDINGS: Normal signal is present in the conus medullaris which terminates at T12 L1. A hemangioma is again noted on the right at L1. Chronic endplate degenerative changes have progressed at L5-S1. Uterine fibroids are evident within a retroflexed uterus. There is some fluid within the endometrial cavity. Limited imaging of the abdomen is otherwise unremarkable. Limited imaging of the abdomen is unremarkable. There is no significant adenopathy. Mild to moderate facet hypertrophy is present at L1-2, L2-3, and L3-4 without focal disc protrusion or stenosis. L4-5: A broad-based disc protrusion is present. There is a central annular tear. Mild facet hypertrophy is noted bilaterally. The combination results in mild subarticular and foraminal narrowing bilaterally. L5-S1: A left paramedian disc protrusion results in moderate to severe left subarticular stenosis. There is mild right subarticular narrowing. Moderate left and mild right foraminal stenosis is also present. The foraminal disease has also progressed since the prior exam. IMPRESSION: 1. Moderate to severe left subarticular stenosis at L5-S1, likely impacting the left S1 nerve root due to a left paramedian disc protrusion. 2. Moderate left and mild right foraminal narrowing  at L5-S1 has also progressed, potentially affecting the L5 nerve roots. 3. Mild subarticular and foraminal narrowing bilaterally at L4-5. 4. Multilevel moderate facet degenerative changes throughout the lumbar spine as described. Electronically Signed   By: San Morelle M.D.   On: 03/22/2015 19:42     EKG Interpretation None      MDM   Final diagnoses:  Sciatica of left side  Spinal stenosis of lumbar region  Bulging lumbar disc    Patient pain down to a 2/10 at rest, 5/10 with mvmt. Patient is able to ambulate. She has a follow up appt with Dr. Hal Neer tomorrow morning at 10:30 am. Patient MRI returned.  i personally reviewed the images and radiology  interpretations.  The patient appears reasonably screened and/or stabilized for discharge and I doubt any other medical condition or other [facility] requiring further screening, evaluation, or treatment in the ED at this time prior to discharge.  I personally performed the services described in this documentation, which was scribed in my presence. The recorded information has been reviewed and is accurate.         Ariel Mail, PA-C 03/23/15 1322  Debby Freiberg, MD 03/24/15 [phone]

## 2015-03-22 NOTE — Discharge Instructions (Signed)
Herniated Disk A herniated disk occurs when a disk in your spine bulges out too far. This condition is also called a ruptured disk or slipped disk. Your spine (backbone) is made up of bones called vertebrae. Between each pair of vertebrae is an oval disk with a soft, spongy center that acts as a shock absorber when you move. The spongy center is surrounded by a tough outer ring. When you have a herniated disk, the spongy center of the disk bulges out or ruptures through the outer ring. A herniated disk can press on a nerve between your vertebrae and cause pain. A herniated disk can occur anywhere in your back or neck area, but the lower back is the most common spot. CAUSES  In many cases, a herniated disk occurs just from getting older. As you age, the spongy insides of your disks tend to shrink and dry out. A herniated disk can result from gradual wear and tear. Injury or sudden strain can also cause a herniated disk.  RISK FACTORS Aging is the main risk factor for a herniated disk. Other risk factors include:  Being a man between the ages of 50 and 64 years.  Having a job that requires heavy lifting, bending, or twisting.  Having a job that requires long hours of driving.  Not getting enough exercise.  Being overweight.  Smoking. SIGNS AND SYMPTOMS  Signs and symptoms depend on which disk is herniated.  For a herniated disk in the lower back, you may have sharp pain in:  One part of your leg, hip, or buttocks.  The back of your calf.  The top or sole of your foot (sciatica).   For a herniated disk in the neck, you may feel pain:  When you move your neck.  Near or over your shoulder blade.  That moves to your upper arm, forearm, or fingers.   You may also have muscle weakness. It may be hard to:  Lift your leg or arm.  Stand on your toes.  Squeeze tightly with one of your hands.  Other symptoms can include:  Numbness or tingling in the affected areas of your  body.  Loss of bladder or bowel control. This is a rare but serious sign of a severe herniated disk in the lower back. DIAGNOSIS  Your health care provider will do a physical exam. During this exam, you may have to move certain body parts or assume various positions. For example, your health care provider may do the straight-leg test. This is a good way to test for a herniated disk in your lower back. In this test, the health care provider lifts your leg while you lie on your back. This is to see if you feel pain down your leg. Your health care provider will also check for numbness or loss of feeling.  Your health care provider will also check your:  Reflexes.  Muscle strength.  Posture.  Other tests may be done to help in making a diagnosis. These may include:  An X-ray of the spine to rule out other causes of back pain.   Other imaging studies, such as an MRI or CT scan. This is to check whether the herniated disk is pressing on your spinal canal.  Electromyography (EMG). This test checks the nerves that control muscles. It is sometimes used to identify the specific area of nerve involvement.  TREATMENT  In many cases, herniated disk symptoms go away over a period of days or weeks. You will most  likely be free of symptoms in 3-4 months. Treatment may include the following:  The initial treatment for a herniated disk is ashort period of rest.  Bed rest is often limited to 1 or 2 days. Resting for too long delays recovery.  If you have a herniated disk in your lower back, you should avoid sitting as much as possible because sitting increases pressure on the disk.  Medicines. These may include:   Nonsteroidal anti-inflammatory drugs (NSAIDs).  Muscle relaxants for back spasms.  Narcotic pain medicine if your pain is very bad.   Steroid injections. You may need these along the involved nerve root to help control pain. The steroid is injected in the area of the herniated disk.  It helps by reducing swelling around the disk.  Physical therapy. This may include exercises to strengthen the muscles that help support your spine.   You may need surgery if other treatments do not work.  HOME CARE INSTRUCTIONS Follow all your health care provider's instructions. These may include:  Take all medicines as directed by your health care provider.  Rest for 2 days and then start moving.  Do not sit or stand for long periods of time.  Maintain good posture when sitting and standing.  Avoid movements that cause pain, such as bending or lifting.  When you are able to start lifting things again:  [place] with your knees.  Keep your back straight.  Hold heavy objects close to your body.  If you are overweight, ask your health care provider to help you start a weight-loss program.  When you are able to start exercising, ask your health care provider how much and what type of exercise is best for you.  Work with a physical therapist on stretching and strengthening exercises for your back.  Do not wear high-heeled shoes.  Do not sleep on your belly.  Do not smoke.  Keep all follow-up visits as directed by your health care provider. SEEK MEDICAL CARE IF:  You have back or neck pain that is not getting better after 4 weeks.  You have very bad pain in your back or neck.  You develop numbness, tingling, or weakness along with pain. SEEK IMMEDIATE MEDICAL CARE IF:   You have numbness, tingling, or weakness that makes you unable to use your arms or legs.  You lose control of your bladder or bowels.  You have dizziness or fainting.  You have shortness of breath.  MAKE SURE YOU:   Understand these instructions.  Will watch your condition.  Will get help right away if you are not doing well or get worse.   This information is not intended to replace advice given to you by your health care provider. Make sure you discuss any questions you have with your  health care provider.   Document Released: 05/02/2000 Document Revised: 05/26/2014 Document Reviewed: 04/08/2013 [employer] Interactive Patient Education 2016 [employer]  Herniated Disk With Rehab Between each vertebrae of the spine exists a disk. These disks contain a jelly-like material that helps cushion the spinal column. Occasionally, damage to the supportive ligaments of the vertebrae causes a disk to shift from its normal alignment and place pressure on surrounding structures, such as the spinal cord. This is called a herniated (ruptured) disk. SYMPTOMS   Pain in the back, that often affects one side.  Pain that gets worse with movement, sneezing, coughing, or straining.  Muscle spasms in the back.  Pain, numbness, or weakness affecting one arm or  leg (depending on whether injury is in the neck or low back).  Muscle loss (if the condition has become chronic).  Loss of stool (bowel) or urine (bladder) function. CAUSES  Herniated disks result when a disk becomes weak. The disk eventually ruptures and places pressure on the spinal cord. Herniated disks may occur from sudden injury (acute trauma) such as heavy labor, or from ongoing (chronic) stress, such as obesity.  RISK INCREASES WITH:  Sports that involve downward or twisting pressure on the neck or spine (football, weightlifting, horseback riding competition, bowling, tennis, jogging, track, racquetball, gymnastics).  Poor strength and flexibility.  Failure to warm up properly before activity.  Family history of low back pain or disk disorders.  Previous back surgery (especially fusion).  Preexisting forward displacement of a vertebra (spondylolisthesis).  Poor technique when lifting.  Prolonged sitting, especially with poor posture. PREVENTION  Learn and use proper technique when sitting or lifting.  Warm up and stretch properly before activity.  Maintain physical fitness:  Strength, flexibility, and  endurance.  Cardiovascular fitness.  Maintain a healthy body weight.  If previously injured, avoid any intense physical activity that requires twisting of the body under uncontrollable conditions. PROGNOSIS  If treated properly, herniated disks are usually curable within 6 weeks. Sometimes, surgery is required.  RELATED COMPLICATIONS   Permanent numbness, weakness, or paralysis and muscle loss.  Chronic back pain.  Loss of bowel or bladder function.  Decreased sexual function.  Risks of surgery: infection, bleeding, injury to nerves (persistent or increased numbness, weakness, or paralysis), persistent back pain, and spinal headache. TREATMENT Treatment first involves resting from any aggravating activities and the use of ice and medicine to reduce pain and inflammation. As muscle spasms begin to decrease, it is important to perform strengthening and stretching exercises of the back muscles. These will help teach and reinforce proper body posture. These exercises may be performed at home, or with a therapist. A therapist may complete a further evaluation and recommend additional treatments, such as ultrasound, traction (for herniated disks of the neck), a cervical collar (for herniated disks of the neck), or a corset or back brace (for herniated disks of the low back). Prolonged rest may do more harm than good. Your therapist will teach you proper techniques for performing simple activities, such as lifting an object off the floor or using proper posture while sitting. At night, it is advised that you sleep on your back, on a firm mattress, and place a pillow under your knees. Your caregiver may recommend oral steroids or an injection of corticosteroids in the space around the spinal cord (epidural space) in order to reduce pain and inflammation. For severe cases, surgery is recommended. MEDICATION   If pain medicine is needed, nonsteroidal anti-inflammatory medicines (aspirin and ibuprofen),  or other minor pain relievers (acetaminophen), are often advised.  Do not take pain medicine for 7 days before surgery.  Prescription pain relievers may be given if your caregiver thinks they are needed. Use only as directed and only as much as you need.  Ointments applied to the skin may be helpful.  Corticosteroid injections may be given. These injections should be reserved for the most serious cases, as they can only be given a certain number of times.  Oral steroids may be given to reduce inflammation, although not usually for severe (acute) injuries. HEAT AND COLD  Cold treatment (icing) relieves pain and reduces inflammation. Cold treatment should be applied for 10 to 15 minutes every 2 to  3 hours, and immediately after activity that aggravates your symptoms. Use ice packs or an ice massage.  Heat treatment may be used before performing stretching and strengthening activities prescribed by your caregiver, physical therapist, or athletic trainer. Use a heat pack or a warm water soak. SEEK MEDICAL CARE IF:   Symptoms get worse or do not improve in 2 to 4 weeks, despite treatment.  You develop loss of bowel or bladder function.  New, unexplained symptoms develop. (Drugs used in treatment may produce side effects.) EXERCISES  RANGE OF MOTION (ROM) AND STRETCHING EXERCISES - Herniated Disk (Ruptured Disk) Most people with low back pain will find that their symptoms get worse with excessive bending forward (flexion) or arching at the low back (extension). The exercises that will help resolve your symptoms will focus on the opposite motion. Your physician, physical therapist or athletic trainer will help you determine which exercises will be most helpful to resolve your low back pain. Do not complete any exercises without first consulting with your caregiver. Discontinue any exercises that make your symptoms worse, until you speak to your caregiver. If you have pain, numbness or tingling that  travels down into your buttocks, leg or foot, the goal of this therapy is for these symptoms to move closer to your back and to eventually go away. Sometimes, these leg symptoms will get better, but your low back pain may get worse. This is typically an indication of progress in your rehabilitation. Be sure to be very alert to any changes in your symptoms and to the activities you have done in the 24 hours prior to the change. Sharing this information with your caregiver will allow him or her to best treat your condition. These exercises may help you when beginning to rehabilitate your injury. Your symptoms may go away with or without further involvement from your physician, physical therapist or athletic trainer. While completing these exercises, remember:   Restoring tissue flexibility helps normal motion to return to the joints. This allows healthier, less painful movement and activity.  An effective stretch should be held for at least 30 seconds.  A stretch should never be painful. You should only feel a gentle lengthening or release in the stretched tissue. FLEXION RANGE OF MOTION AND STRETCHING EXERCISES: STRETCH - Flexion, Single Knee to Chest  Lie on a firm bed or floor, with both legs extended in front of you.  Keeping one leg in contact with the floor, bring your opposite knee to your chest. Hold your leg in place by either grabbing behind your thigh or at your knee.  Pull until you feel a gentle stretch in your low back. Hold for __________ seconds.  Slowly release your grasp and repeat the exercise with the opposite side. Repeat __________ times. Complete this exercise __________ times per day.  STRETCH - Flexion, Double Knee to Chest   Lie on a firm bed or floor, with both legs extended in front of you.  Keeping one leg in contact with the floor, bring your opposite knee to your chest.  Tense your stomach muscles to support your back and then lift your other knee to your chest.  Hold your legs in place by either grabbing behind your thighs or at your knees.  Pull both knees toward your chest until you feel a gentle stretch in your low back. Hold for __________ seconds.  Tense your stomach muscles and slowly return one leg at a time to the floor. Repeat __________ times. Complete this exercise  __________ times per day.  STRETCH - Low Trunk Rotation  Lie on a firm bed or floor. Keeping your legs in front of you, bend your knees so they are both pointed toward the ceiling and your feet are flat on the floor.  Extend your arms out to the side. This will stabilize your upper body by keeping your shoulders in contact with the floor.  Gently and slowly drop both knees together to one side, until you feel a gentle stretch in your low back. Hold for __________ seconds.  Tense your stomach muscles to support your low back as you bring your knees back to the starting position. Repeat the exercise while dropping both knees to the other side. Repeat __________ times. Complete this exercise __________ times per day  EXTENSION RANGE OF MOTION AND FLEXIBILITY EXERCISES: STRETCH - Extension, Prone on Elbows   Lie on your stomach on the floor. (A bed will be too soft.) Place your palms about shoulder width apart.  Place your elbows under your shoulders. If this is too painful, stack pillows under your chest.  Allow your body to relax so that your hips drop lower and make contact more completely with the floor.  Hold this position for __________ seconds.  Slowly return to lying flat on the floor. Repeat __________ times. Complete this exercise __________ times per day.  RANGE OF MOTION - Extension, Prone Press Ups   Lie on your stomach on the floor. (A bed will be too soft.) Place your palms about shoulder width apart and at the height of your head.  Keeping your back as relaxed as possible, slowly straighten your elbows while keeping your hips on the floor. You may adjust the  placement of your hands to maximize your comfort. As you gain motion, your hands will come more underneath your shoulders.  Hold this position for __________ seconds.  Slowly return to lying flat on the floor. Repeat __________ times. Complete this exercise __________ times per day.  RANGE OF MOTION- Quadruped, Neutral Spine   Assume a hands and knees position on a firm surface. Keep your hands under your shoulders and your knees under your hips. You may place padding under your knees for comfort.  Drop your head and point your tail bone toward the ground below you. This will round out your low back like an angry cat. Hold this position for __________ seconds.  Slowly lift your head and release your tail bone so that your back sags into a large arch, like an old horse.  Hold this position for __________ seconds.  Repeat this until you feel limber in your low back.  Now, find your "sweet spot." This will be the most comfortable position somewhere between the two previous positions. This is your neutral spine. Once you have found this position, tense your stomach muscles to support your low back.  Hold this position for __________ seconds. Repeat __________ times. Complete this exercise __________ times per day.  STRENGTHENING EXERCISES - Herniated Disk (Ruptured Disk) These exercises may help you when beginning to rehabilitate your injury. These exercises should be done near your "sweet spot." This is the neutral, low-back arch, somewhere between fully rounded and fully arched, that is your least painful position. When performed in this safe range of motion, these exercises can be used for people who have either a flexion or extension based injury. These exercises may resolve your symptoms with or without further involvement from your physician, physical therapist or athletic trainer. While completing  these exercises, remember:   Muscles can gain both the endurance and the strength needed for  everyday activities through controlled exercises.  Complete these exercises as instructed by your physician, physical therapist or athletic trainer. Increase the resistance and repetitions only as guided.  You may experience muscle soreness or fatigue, but the pain or discomfort you are trying to eliminate should never worsen during these exercises. If this pain does get worse, stop and make sure you are following the directions exactly. If the pain is still present after adjustments, discontinue the exercise until you can discuss the trouble with your clinician. STRENGTHENING - Deep Abdominals, Pelvic Tilt   Lie on a firm bed or floor. Keeping your legs in front of you, bend your knees so they are both pointed toward the ceiling and your feet are flat on the floor.  Tense your lower abdominal muscles to press your low back into the floor. This motion will rotate your pelvis so that your tail bone is scooping upwards rather than pointing at your feet or into the floor.  With a gentle tension and even breathing, hold this position for __________ seconds. Repeat __________ times. Complete this exercise __________ times per day.  STRENGTHENING - Abdominals, Crunches   Lie on a firm bed or floor. Keeping your legs in front of you, bend your knees so they are both pointed toward the ceiling and your feet are flat on the floor. Cross your arms over your chest.  Slightly tip your chin down without bending your neck.  Tense your abdominals and slowly lift your trunk high enough so that your shoulder blades are just off the floor. Lifting higher can put too much stress on the low back and does not further strengthen your abdominal muscles.  With control, return to the starting position. Repeat __________ times. Complete this exercise __________ times per day.  STRENGTHENING - Quadruped, Opposite UE/LE Lift  Assume a hands and knees position on a firm surface. Keep your hands under your shoulders and  your knees under your hips. You may place padding under your knees for comfort.  Find your neutral spine and gently tense your abdominal muscles so that you can maintain this position. Your shoulders and hips should form a rectangle that is parallel with the floor and is not twisted.  Keeping your trunk steady, lift your right hand no higher than your shoulder. Then lift your left leg no higher than your hip. Make sure you are not holding your breath. Hold this position for __________ seconds.  Continuing to keep your abdominal muscles tense and your back steady, slowly return to your starting position. Repeat with the opposite arm and leg. Repeat __________ times. Complete this exercise __________ times per day.  STRENGTHENING - Lower Abdominals, Double Knee Lift  Lie on a firm bed or floor. Keeping your legs in front of you, bend your knees so they are both pointed toward the ceiling and your feet are flat on the floor.  Tense your abdominal muscles to brace your low back and slowly lift both of your knees until they come over your hips. Be certain not to hold your breath.  Hold for __________ seconds. Using your abdominal muscles, return to the starting position in a slow and controlled manner. Repeat __________ times. Complete this exercise __________ times per day.  POSTURE AND BODY MECHANICS CONSIDERATIONS - Herniated Disc (Ruptured Disk) Keeping correct posture when sitting, standing or completing your activities will reduce the stress put on different  body tissues, allowing injured tissues a chance to heal and limiting painful experiences. The following are general guidelines for improved posture. Your physician or physical therapist will provide you with any instructions specific to your needs. While reading these guidelines, remember:  The exercises prescribed by your provider will help you build the flexibility and strength to maintain correct postures.  The correct posture provides  the best environment for your joints to work. All of your joints have less wear and tear when properly supported by a spine with good posture. This means you will experience a healthier, less painful body.  Correct posture must be practiced with all of your activities, especially prolonged sitting and standing. Correct posture is as important when doing repetitive low-stress activities (typing) as it is when doing a single heavy-load activity (lifting). RESTING POSITIONS Consider which positions are most painful for you when choosing a resting position. If you have pain with flexion-based activities (sitting, bending, stooping, squatting), choose a position that allows you to rest in a less flexed posture. You would want to avoid curling into a fetal position on your side. If your pain gets worse with extension-based activities (prolonged standing, working overhead), avoid resting in an extended position such as sleeping on your stomach. Most people will find more comfort when they rest with their spine in a more neutral position, neither too rounded nor too arched. Lying on a non-sagging bed on your side, with a pillow between your knees, or on your back with a pillow under your knees will often provide some relief. Keep in mind, being in any one position for a prolonged period of time, no matter how correct your posture, can still lead to stiffness. PROPER SITTING POSTURE In order to minimize stress and discomfort on your spine, you must sit with correct posture. Sitting with good posture should be effortless for a healthy body. Returning to good posture is a gradual process. Many people can work toward this most comfortably by using various supports until they have the flexibility and strength to maintain this posture on their own. When sitting with proper posture, your ears will fall over your shoulders and your shoulders will fall over your hips. You should use the back of the chair to support your upper  back. Your low back will be in a neutral position, just slightly arched. You may place a small pillow or folded towel at the base of your low back for support.  When working at a desk, create an environment that supports good, upright posture. Without extra support, muscles tire, which leads to excessive strain on joints and other tissues. Keep these recommendations in mind. CHAIR  A chair should be able to slide under your desk when your back makes contact with the back of the chair. This allows you to work closely.  The chair's height should allow your eyes to be level with the upper part of your monitor and your hands to be slightly lower than your elbows. BODY POSITION  Your feet should make contact with the floor. If this is not possible, use a foot rest.  Keep your ears over your shoulders. This will reduce stress on your neck and low back. INCORRECT SITTING POSTURES  If you are feeling tired and unable to assume a healthy sitting posture, do not slouch or slump. This puts excessive strain on your back tissues, causing more damage and pain. Healthier options include:  Using more support, like a lumbar pillow.  Switching tasks, to something that  requires you to be upright or walking.  Talking a brief walk.  Lying down to rest in a neutral-spine position. PROLONGED STANDING WHILE SLIGHTLY LEANING FORWARD  When completing a task that requires you to lean forward while standing in one place for a long time, place either foot up on a stationary 2-4 inch high object, to help maintain the best posture. When both feet are on the ground, the low back tends to lose its slight inward curve. If this curve flattens (or becomes too large), the back and your other joints will experience too much stress, tire more quickly and can cause pain. CORRECT STANDING POSTURES Proper standing posture should be assumed with all daily activities, even if they only take a few moments, like when brushing your  teeth. As in sitting, your ears should fall over your shoulders and your shoulders should fall over your hips. You should keep a slight tension in your abdominal muscles to brace your spine. Your tailbone should point down to the ground, not behind your body, resulting in an over-extended swayback posture.  INCORRECT STANDING POSTURES  Common incorrect standing postures include a forward head, locked knees or an excessive swayback. WALKING Walk with an upright posture. Your ears, shoulders and hips should all line-up. PROLONGED ACTIVITY IN A FLEXED POSITION When completing a task that requires you to bend forward at your waist or lean over a low surface, try finding a way to stabilize 3 out of 4 of your limbs. You can place a hand or elbow on your thigh, or rest a knee on the surface you are reaching across. This will provide you more stability so that your muscles do not tire as quickly. By keeping your knees relaxed, or slightly bent, you will also reduce stress across your low back. CORRECT LIFTING TECHNIQUES DO :   Assume a wide stance. This will provide you more stability and the opportunity to get as close as possible to the object you are lifting.  Tense your abdominals to brace your spine. Then, bend at the knees and hips. Keeping your back locked in a neutral-spine position, lift using your leg muscles. Lift with your legs, keeping your back straight.  Test the weight of unknown objects before attempting to lift them.  Try to keep your elbows down by your sides, in order get the best strength from your shoulders when carrying an object.  Always ask for help when lifting heavy or awkward objects. INCORRECT LIFTING TECHNIQUES DO NOT:   Lock your knees when lifting, even if it is a small object.  Bend and twist. Pivot at your feet or move your feet when needing to change directions.  Assume that you can safely pick up even a paper clip, without proper posture.   This information is  not intended to replace advice given to you by your health care provider. Make sure you discuss any questions you have with your health care provider.   Document Released: 05/05/2005 Document Revised: 05/26/2014 Document Reviewed: 08/17/2008 Elsevier Interactive Patient Education 2016 [place] The sciatic nerve runs from the back down the leg and is responsible for sensation and control of the muscles in the back (posterior) side of the thigh, lower leg, and foot. Sciatica is a condition that is characterized by inflammation of this nerve.  SYMPTOMS   Signs of nerve damage, including numbness and/or weakness along the posterior side of the lower extremity.  Pain in the back of the thigh that  may also travel down the leg.  Pain that worsens when sitting for long periods of time.  Occasionally, pain in the back or buttock. CAUSES  Inflammation of the sciatic nerve is the cause of sciatica. The inflammation is due to something irritating the nerve. Common sources of irritation include:  Sitting for long periods of time.  Direct trauma to the nerve.  Arthritis of the spine.  Herniated or ruptured disk.  Slipping of the vertebrae (spondylolisthesis).  Pressure from soft tissues, such as muscles or ligament-like tissue (fascia). RISK INCREASES WITH:  Sports that place pressure or stress on the spine (football or weightlifting).  Poor strength and flexibility.  Failure to warm up properly before activity.  Family history of low back pain or disk disorders.  Previous back injury or surgery.  Poor body mechanics, especially when lifting, or poor posture. PREVENTION   Warm up and stretch properly before activity.  Maintain physical fitness:  Strength, flexibility, and endurance.  Cardiovascular fitness.  Learn and use proper technique, especially with posture and lifting. When possible, have coach correct improper technique.  Avoid activities  that place stress on the spine. PROGNOSIS If treated properly, then sciatica usually resolves within 6 weeks. However, occasionally surgery is necessary.  RELATED COMPLICATIONS   Permanent nerve damage, including pain, numbness, tingle, or weakness.  Chronic back pain.  Risks of surgery: infection, bleeding, nerve damage, or damage to surrounding tissues. TREATMENT Treatment initially involves resting from any activities that aggravate your symptoms. The use of ice and medication may help reduce pain and inflammation. The use of strengthening and stretching exercises may help reduce pain with activity. These exercises may be performed at home or with referral to a therapist. A therapist may recommend further treatments, such as transcutaneous electronic nerve stimulation (TENS) or ultrasound. Your caregiver may recommend corticosteroid injections to help reduce inflammation of the sciatic nerve. If symptoms persist despite non-surgical (conservative) treatment, then surgery may be recommended. MEDICATION  If pain medication is necessary, then nonsteroidal anti-inflammatory medications, such as aspirin and ibuprofen, or other minor pain relievers, such as acetaminophen, are often recommended.  Do not take pain medication for 7 days before surgery.  Prescription pain relievers may be given if deemed necessary by your caregiver. Use only as directed and only as much as you need.  Ointments applied to the skin may be helpful.  Corticosteroid injections may be given by your caregiver. These injections should be reserved for the most serious cases, because they may only be given a certain number of times. HEAT AND COLD  Cold treatment (icing) relieves pain and reduces inflammation. Cold treatment should be applied for 10 to 15 minutes every 2 to 3 hours for inflammation and pain and immediately after any activity that aggravates your symptoms. Use ice packs or massage the area with a piece of ice  (ice massage).  Heat treatment may be used prior to performing the stretching and strengthening activities prescribed by your caregiver, physical therapist, or athletic trainer. Use a heat pack or soak the injury in warm water. SEEK MEDICAL CARE IF:  Treatment seems to offer no benefit, or the condition worsens.  Any medications produce adverse side effects. EXERCISES  RANGE OF MOTION (ROM) AND STRETCHING EXERCISES - Sciatica Most people with sciatic will find that their symptoms worsen with either excessive bending forward (flexion) or arching at the low back (extension). The exercises which will help resolve your symptoms will focus on the opposite motion. Your physician, physical therapist or  athletic trainer will help you determine which exercises will be most helpful to resolve your low back pain. Do not complete any exercises without first consulting with your clinician. Discontinue any exercises which worsen your symptoms until you speak to your clinician. If you have pain, numbness or tingling which travels down into your buttocks, leg or foot, the goal of the therapy is for these symptoms to move closer to your back and eventually resolve. Occasionally, these leg symptoms will get better, but your low back pain may worsen; this is typically an indication of progress in your rehabilitation. Be certain to be very alert to any changes in your symptoms and the activities in which you participated in the 24 hours prior to the change. Sharing this information with your clinician will allow him/her to most efficiently treat your condition. These exercises may help you when beginning to rehabilitate your injury. Your symptoms may resolve with or without further involvement from your physician, physical therapist or athletic trainer. While completing these exercises, remember:   Restoring tissue flexibility helps normal motion to return to the joints. This allows healthier, less painful movement and  activity.  An effective stretch should be held for at least 30 seconds.  A stretch should never be painful. You should only feel a gentle lengthening or release in the stretched tissue. FLEXION RANGE OF MOTION AND STRETCHING EXERCISES: STRETCH - Flexion, Single Knee to Chest   Lie on a firm bed or floor with both legs extended in front of you.  Keeping one leg in contact with the floor, bring your opposite knee to your chest. Hold your leg in place by either grabbing behind your thigh or at your knee.  Pull until you feel a gentle stretch in your low back. Hold __________ seconds.  Slowly release your grasp and repeat the exercise with the opposite side. Repeat __________ times. Complete this exercise __________ times per day.  STRETCH - Flexion, Double Knee to Chest  Lie on a firm bed or floor with both legs extended in front of you.  Keeping one leg in contact with the floor, bring your opposite knee to your chest.  Tense your stomach muscles to support your back and then lift your other knee to your chest. Hold your legs in place by either grabbing behind your thighs or at your knees.  Pull both knees toward your chest until you feel a gentle stretch in your low back. Hold __________ seconds.  Tense your stomach muscles and slowly return one leg at a time to the floor. Repeat __________ times. Complete this exercise __________ times per day.  STRETCH - Low Trunk Rotation   Lie on a firm bed or floor. Keeping your legs in front of you, bend your knees so they are both pointed toward the ceiling and your feet are flat on the floor.  Extend your arms out to the side. This will stabilize your upper body by keeping your shoulders in contact with the floor.  Gently and slowly drop both knees together to one side until you feel a gentle stretch in your low back. Hold for __________ seconds.  Tense your stomach muscles to support your low back as you bring your knees back to the  starting position. Repeat the exercise to the other side. Repeat __________ times. Complete this exercise __________ times per day  EXTENSION RANGE OF MOTION AND FLEXIBILITY EXERCISES: STRETCH - Extension, Prone on Elbows  Lie on your stomach on the floor, a bed will  be too soft. Place your palms about shoulder width apart and at the height of your head.  Place your elbows under your shoulders. If this is too painful, stack pillows under your chest.  Allow your body to relax so that your hips drop lower and make contact more completely with the floor.  Hold this position for __________ seconds.  Slowly return to lying flat on the floor. Repeat __________ times. Complete this exercise __________ times per day.  RANGE OF MOTION - Extension, Prone Press Ups  Lie on your stomach on the floor, a bed will be too soft. Place your palms about shoulder width apart and at the height of your head.  Keeping your back as relaxed as possible, slowly straighten your elbows while keeping your hips on the floor. You may adjust the placement of your hands to maximize your comfort. As you gain motion, your hands will come more underneath your shoulders.  Hold this position __________ seconds.  Slowly return to lying flat on the floor. Repeat __________ times. Complete this exercise __________ times per day.  STRENGTHENING EXERCISES - Sciatica  These exercises may help you when beginning to rehabilitate your injury. These exercises should be done near your "sweet spot." This is the neutral, low-back arch, somewhere between fully rounded and fully arched, that is your least painful position. When performed in this safe range of motion, these exercises can be used for people who have either a flexion or extension based injury. These exercises may resolve your symptoms with or without further involvement from your physician, physical therapist or athletic trainer. While completing these exercises, remember:    Muscles can gain both the endurance and the strength needed for everyday activities through controlled exercises.  Complete these exercises as instructed by your physician, physical therapist or athletic trainer. Progress with the resistance and repetition exercises only as your caregiver advises.  You may experience muscle soreness or fatigue, but the pain or discomfort you are trying to eliminate should never worsen during these exercises. If this pain does worsen, stop and make certain you are following the directions exactly. If the pain is still present after adjustments, discontinue the exercise until you can discuss the trouble with your clinician. STRENGTHENING - Deep Abdominals, Pelvic Tilt   Lie on a firm bed or floor. Keeping your legs in front of you, bend your knees so they are both pointed toward the ceiling and your feet are flat on the floor.  Tense your lower abdominal muscles to press your low back into the floor. This motion will rotate your pelvis so that your tail bone is scooping upwards rather than pointing at your feet or into the floor.  With a gentle tension and even breathing, hold this position for __________ seconds. Repeat __________ times. Complete this exercise __________ times per day.  STRENGTHENING - Abdominals, Crunches   Lie on a firm bed or floor. Keeping your legs in front of you, bend your knees so they are both pointed toward the ceiling and your feet are flat on the floor. Cross your arms over your chest.  Slightly tip your chin down without bending your neck.  Tense your abdominals and slowly lift your trunk high enough to just clear your shoulder blades. Lifting higher can put excessive stress on the low back and does not further strengthen your abdominal muscles.  Control your return to the starting position. Repeat __________ times. Complete this exercise __________ times per day.  STRENGTHENING - Quadruped, Opposite UE/LE  Lift  Assume a  hands and knees position on a firm surface. Keep your hands under your shoulders and your knees under your hips. You may place padding under your knees for comfort.  Find your neutral spine and gently tense your abdominal muscles so that you can maintain this position. Your shoulders and hips should form a rectangle that is parallel with the floor and is not twisted.  Keeping your trunk steady, lift your right hand no higher than your shoulder and then your left leg no higher than your hip. Make sure you are not holding your breath. Hold this position __________ seconds.  Continuing to keep your abdominal muscles tense and your back steady, slowly return to your starting position. Repeat with the opposite arm and leg. Repeat __________ times. Complete this exercise __________ times per day.  STRENGTHENING - Abdominals and Quadriceps, Straight Leg Raise   Lie on a firm bed or floor with both legs extended in front of you.  Keeping one leg in contact with the floor, bend the other knee so that your foot can rest flat on the floor.  Find your neutral spine, and tense your abdominal muscles to maintain your spinal position throughout the exercise.  Slowly lift your straight leg off the floor about 6 inches for a count of 15, making sure to not hold your breath.  Still keeping your neutral spine, slowly lower your leg all the way to the floor. Repeat this exercise with each leg __________ times. Complete this exercise __________ times per day. POSTURE AND BODY MECHANICS CONSIDERATIONS - Sciatica Keeping correct posture when sitting, standing or completing your activities will reduce the stress put on different body tissues, allowing injured tissues a chance to heal and limiting painful experiences. The following are general guidelines for improved posture. Your physician or physical therapist will provide you with any instructions specific to your needs. While reading these guidelines,  remember:  The exercises prescribed by your provider will help you have the flexibility and strength to maintain correct postures.  The correct posture provides the optimal environment for your joints to work. All of your joints have less wear and tear when properly supported by a spine with good posture. This means you will experience a healthier, less painful body.  Correct posture must be practiced with all of your activities, especially prolonged sitting and standing. Correct posture is as important when doing repetitive low-stress activities (typing) as it is when doing a single heavy-load activity (lifting). RESTING POSITIONS Consider which positions are most painful for you when choosing a resting position. If you have pain with flexion-based activities (sitting, bending, stooping, squatting), choose a position that allows you to rest in a less flexed posture. You would want to avoid curling into a fetal position on your side. If your pain worsens with extension-based activities (prolonged standing, working overhead), avoid resting in an extended position such as sleeping on your stomach. Most people will find more comfort when they rest with their spine in a more neutral position, neither too rounded nor too arched. Lying on a non-sagging bed on your side with a pillow between your knees, or on your back with a pillow under your knees will often provide some relief. Keep in mind, being in any one position for a prolonged period of time, no matter how correct your posture, can still lead to stiffness. PROPER SITTING POSTURE In order to minimize stress and discomfort on your spine, you must sit with correct posture Sitting with  good posture should be effortless for a healthy body. Returning to good posture is a gradual process. Many people can work toward this most comfortably by using various supports until they have the flexibility and strength to maintain this posture on their own. When sitting  with proper posture, your ears will fall over your shoulders and your shoulders will fall over your hips. You should use the back of the chair to support your upper back. Your low back will be in a neutral position, just slightly arched. You may place a small pillow or folded towel at the base of your low back for support.  When working at a desk, create an environment that supports good, upright posture. Without extra support, muscles fatigue and lead to excessive strain on joints and other tissues. Keep these recommendations in mind: CHAIR:   A chair should be able to slide under your desk when your back makes contact with the back of the chair. This allows you to work closely.  The chair's height should allow your eyes to be level with the upper part of your monitor and your hands to be slightly lower than your elbows. BODY POSITION  Your feet should make contact with the floor. If this is not possible, use a foot rest.  Keep your ears over your shoulders. This will reduce stress on your neck and low back. INCORRECT SITTING POSTURES   If you are feeling tired and unable to assume a healthy sitting posture, do not slouch or slump. This puts excessive strain on your back tissues, causing more damage and pain. Healthier options include:  Using more support, like a lumbar pillow.  Switching tasks to something that requires you to be upright or walking.  Talking a brief walk.  Lying down to rest in a neutral-spine position. PROLONGED STANDING WHILE SLIGHTLY LEANING FORWARD  When completing a task that requires you to lean forward while standing in one place for a long time, place either foot up on a stationary 2-4 inch high object to help maintain the best posture. When both feet are on the ground, the low back tends to lose its slight inward curve. If this curve flattens (or becomes too large), then the back and your other joints will experience too much stress, fatigue more quickly and can  cause pain.  CORRECT STANDING POSTURES Proper standing posture should be assumed with all daily activities, even if they only take a few moments, like when brushing your teeth. As in sitting, your ears should fall over your shoulders and your shoulders should fall over your hips. You should keep a slight tension in your abdominal muscles to brace your spine. Your tailbone should point down to the ground, not behind your body, resulting in an over-extended swayback posture.  INCORRECT STANDING POSTURES  Common incorrect standing postures include a forward head, locked knees and/or an excessive swayback. WALKING Walk with an upright posture. Your ears, shoulders and hips should all line-up. PROLONGED ACTIVITY IN A FLEXED POSITION When completing a task that requires you to bend forward at your waist or lean over a low surface, try to find a way to stabilize 3 of 4 of your limbs. You can place a hand or elbow on your thigh or rest a knee on the surface you are reaching across. This will provide you more stability so that your muscles do not fatigue as quickly. By keeping your knees relaxed, or slightly bent, you will also reduce stress across your low back. CORRECT  LIFTING TECHNIQUES DO :   Assume a wide stance. This will provide you more stability and the opportunity to get as close as possible to the object which you are lifting.  Tense your abdominals to brace your spine; then bend at the knees and hips. Keeping your back locked in a neutral-spine position, lift using your leg muscles. Lift with your legs, keeping your back straight.  Test the weight of unknown objects before attempting to lift them.  Try to keep your elbows locked down at your sides in order get the best strength from your shoulders when carrying an object.  Always ask for help when lifting heavy or awkward objects. INCORRECT LIFTING TECHNIQUES DO NOT:   Lock your knees when lifting, even if it is a small object.  Bend and  twist. Pivot at your feet or move your feet when needing to change directions.  Assume that you cannot safely pick up a paperclip without proper posture.   This information is not intended to replace advice given to you by your health care provider. Make sure you discuss any questions you have with your health care provider.   Document Released: 05/05/2005 Document Revised: 09/19/2014 Document Reviewed: 08/17/2008 Elsevier Interactive Patient Education [employer].

## 2015-03-22 NOTE — ED Notes (Signed)
PA to see and assess patient before RN assessment. See PA assessment.

## 2015-03-23 ENCOUNTER — Ambulatory Visit
Admission: RE | Admit: 2015-03-23 | Discharge: 2015-03-23 | Disposition: A | Discharge status: Home / Self Care | Payer: BC Managed Care – PPO | Source: Ambulatory Visit | Attending: Neurosurgery | Admitting: Neurosurgery

## 2015-03-23 ENCOUNTER — Other Ambulatory Visit: Payer: Self-pay | Admitting: Neurosurgery

## 2015-03-23 DIAGNOSIS — M5127 Other intervertebral disc displacement, lumbosacral region: Secondary | ICD-10-CM

## 2015-03-23 MED ORDER — METHYLPREDNISOLONE ACETATE 40 MG/ML INJ SUSP (RADIOLOG
120.0000 mg | Freq: Once | INTRAMUSCULAR | Status: AC
  Administered 2015-03-23: 120 mg via EPIDURAL

## 2015-03-23 MED ORDER — IOHEXOL 180 MG/ML  SOLN
1.0000 mL | Freq: Once | INTRAMUSCULAR | Status: DC | PRN
  Administered 2015-03-23: 1 mL via EPIDURAL

## 2015-03-23 NOTE — Discharge Instructions (Signed)

## 2015-04-04 ENCOUNTER — Other Ambulatory Visit: Payer: Self-pay | Admitting: Neurosurgery

## 2015-04-04 DIAGNOSIS — M5127 Other intervertebral disc displacement, lumbosacral region: Secondary | ICD-10-CM

## 2015-04-06 ENCOUNTER — Ambulatory Visit
Admission: RE | Admit: 2015-04-06 | Discharge: 2015-04-06 | Disposition: A | Discharge status: Home / Self Care | Payer: BC Managed Care – PPO | Source: Ambulatory Visit | Attending: Neurosurgery | Admitting: Neurosurgery

## 2015-04-06 ENCOUNTER — Other Ambulatory Visit: Payer: Self-pay | Admitting: Neurosurgery

## 2015-04-06 DIAGNOSIS — M5127 Other intervertebral disc displacement, lumbosacral region: Secondary | ICD-10-CM

## 2015-04-06 MED ORDER — IOHEXOL 180 MG/ML  SOLN
1.0000 mL | Freq: Once | INTRAMUSCULAR | Status: DC | PRN
  Administered 2015-04-06: 1 mL via EPIDURAL

## 2015-04-06 MED ORDER — METHYLPREDNISOLONE ACETATE 40 MG/ML INJ SUSP (RADIOLOG
120.0000 mg | Freq: Once | INTRAMUSCULAR | Status: AC
  Administered 2015-04-06: 120 mg via EPIDURAL

## 2015-04-06 NOTE — Discharge Instructions (Signed)

## 2015-04-11 ENCOUNTER — Other Ambulatory Visit: Payer: BC Managed Care – PPO

## 2016-03-21 ENCOUNTER — Other Ambulatory Visit: Payer: Self-pay | Admitting: Neurosurgery

## 2016-03-21 DIAGNOSIS — M5127 Other intervertebral disc displacement, lumbosacral region: Secondary | ICD-10-CM

## 2016-03-27 NOTE — Discharge Instructions (Signed)

## 2016-03-28 ENCOUNTER — Ambulatory Visit
Admission: RE | Admit: 2016-03-28 | Discharge: 2016-03-28 | Disposition: A | Discharge status: Home / Self Care | Payer: BC Managed Care – PPO | Source: Ambulatory Visit | Attending: Neurosurgery | Admitting: Neurosurgery

## 2016-03-28 ENCOUNTER — Other Ambulatory Visit: Payer: Self-pay | Admitting: Neurosurgery

## 2016-03-28 DIAGNOSIS — M5127 Other intervertebral disc displacement, lumbosacral region: Secondary | ICD-10-CM

## 2016-03-28 MED ORDER — METHYLPREDNISOLONE ACETATE 40 MG/ML INJ SUSP (RADIOLOG
120.0000 mg | Freq: Once | INTRAMUSCULAR | Status: DC
Start: 2016-03-28 — End: 2016-03-29

## 2016-03-28 MED ORDER — IOPAMIDOL (ISOVUE-M 200) INJECTION 41%: 1.0000 mL | Freq: Once | INTRAMUSCULAR | Status: DC

## 2016-08-16 IMAGING — MR MR LUMBAR SPINE W/O CM
4 of 5 series · 18 of 48 positions shown · non-contrast
Comparison: MRI of the lumbar spine from [REDACTED] 09/29/2012.

CLINICAL DATA: Severe left-sided sciatica with numbness. Sudden
onset of severe intermittent low back pain extending into the left
buttocks over the last 4 days. Symptoms are worse today. Numbness
and paresthesia is along the posterior aspect of the left lower
extremity.

EXAM:
MRI LUMBAR SPINE WITHOUT CONTRAST
TECHNIQUE: Multiplanar, multisequence MR imaging of the lumbar spine was
performed. No intravenous contrast was administered.

[Series 3: T2 · sagittal · 4.0mm · 0.55mm/px · 6 of 13 slices shown (1 of 2)]
[im 1/13]
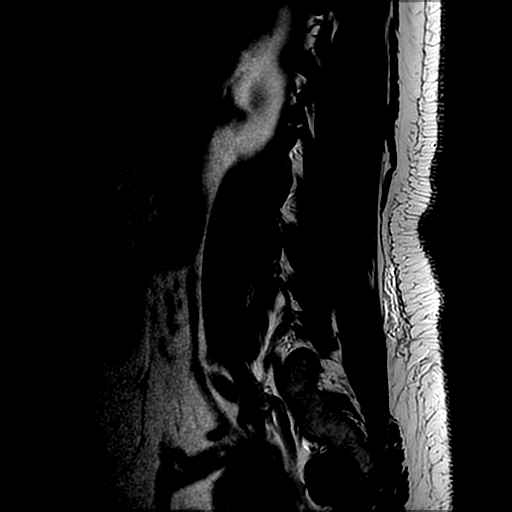
[im 3/13]
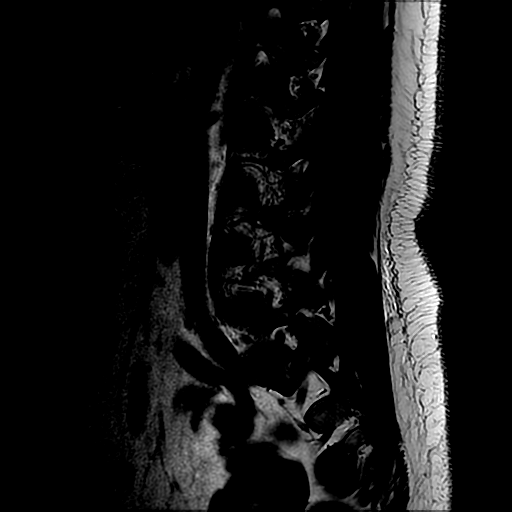
[im 5/13]
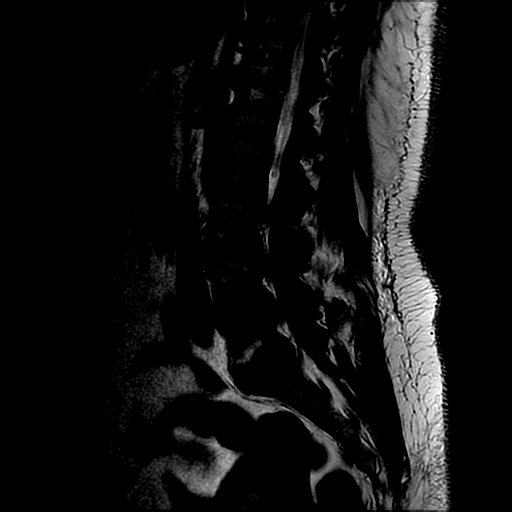
[im 8/13]
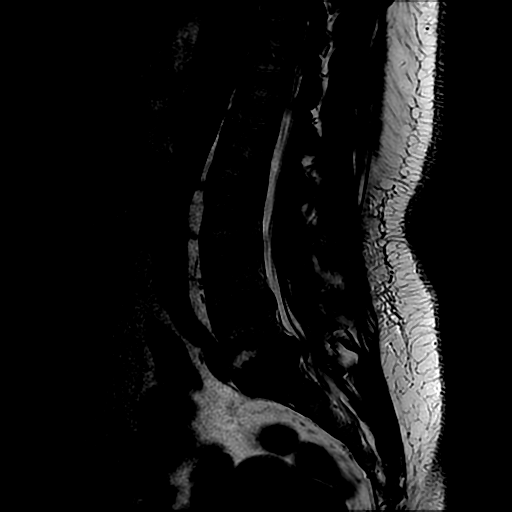
[im 10/13]
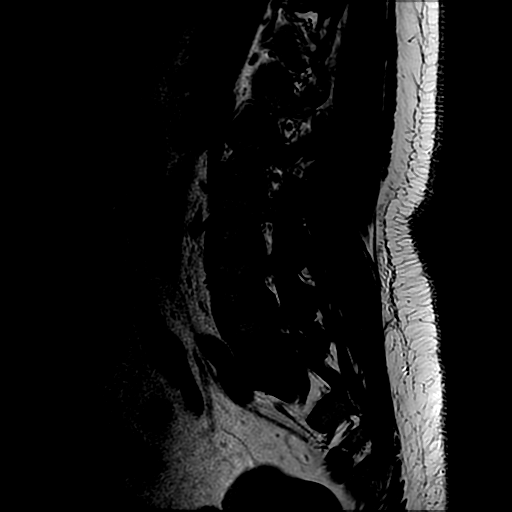
[im 13/13]
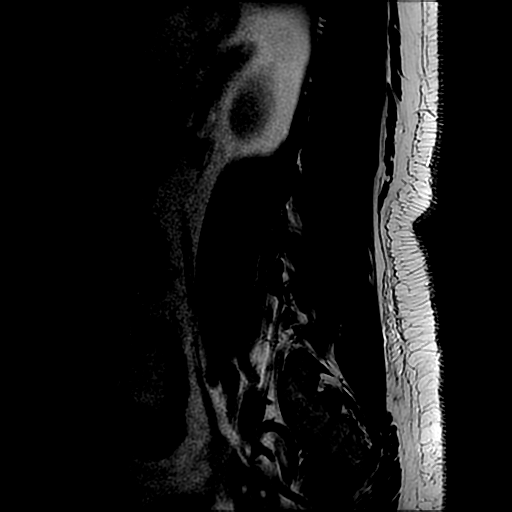

[Series 4: T1 · sagittal · 4.0mm · 0.55mm/px · 3 of 13 slices shown (1 of 2)]
[im 1/13]
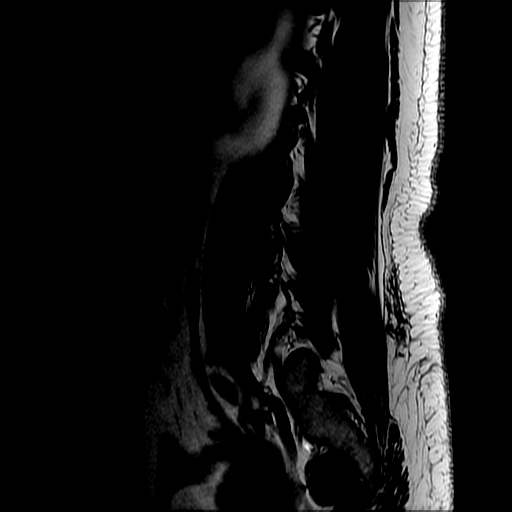
[im 7/13]
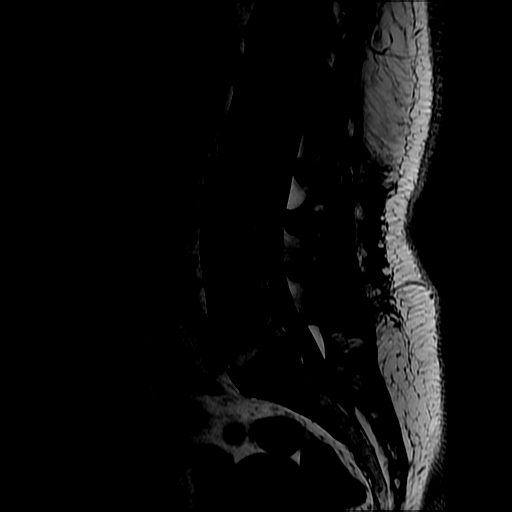
[im 13/13]
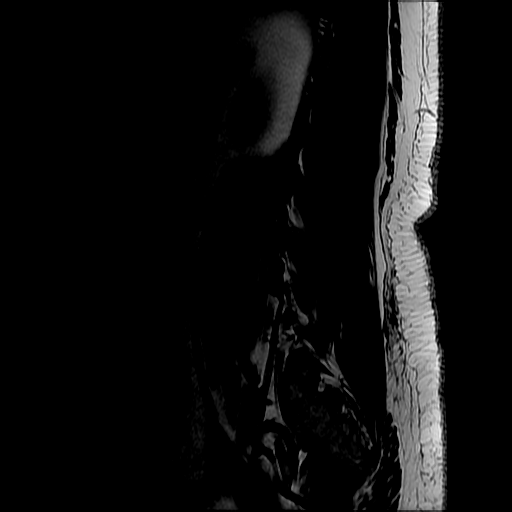

[Series 5: T2 · axial · 4.0mm · 0.39mm/px · z∈[-172,+11]mm · 6 of 39 slices shown (2 of 2)]
[im 3/39]
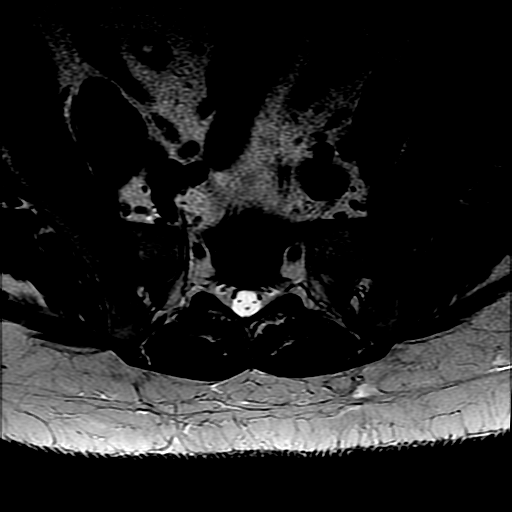
[im 6/39]
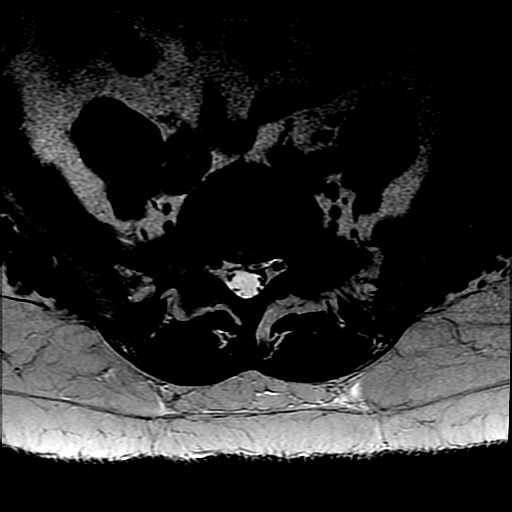
[im 8/39]
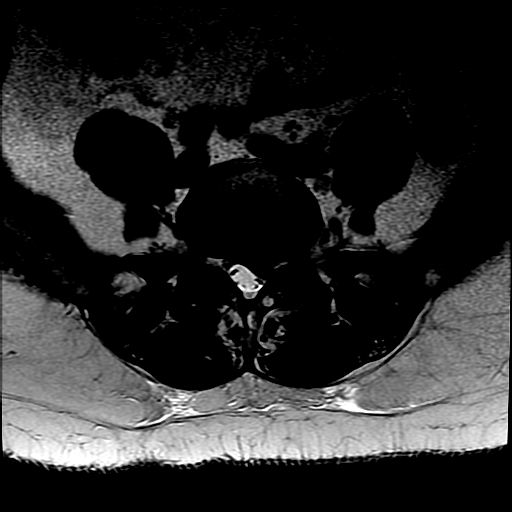
[im 13/39]
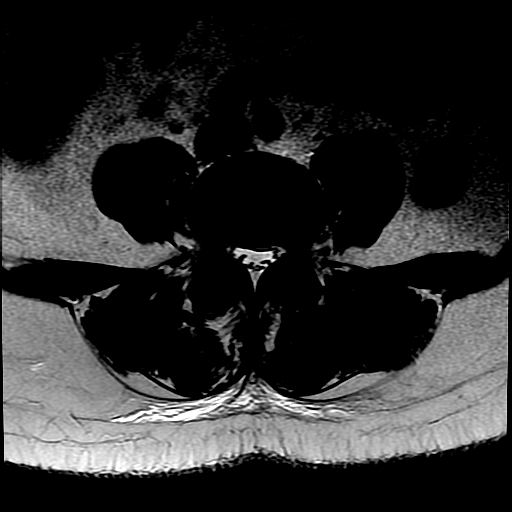
[im 21/39]
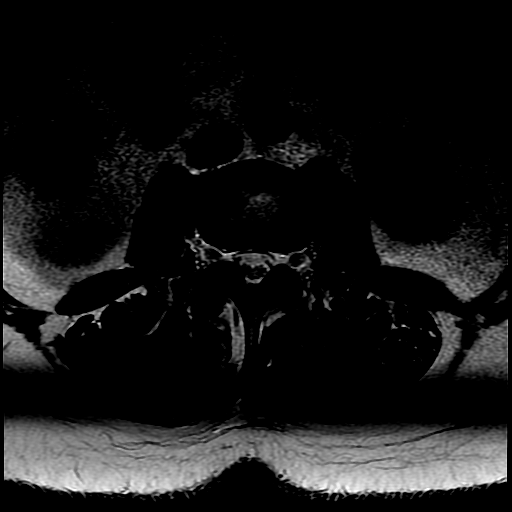
[im 33/39]
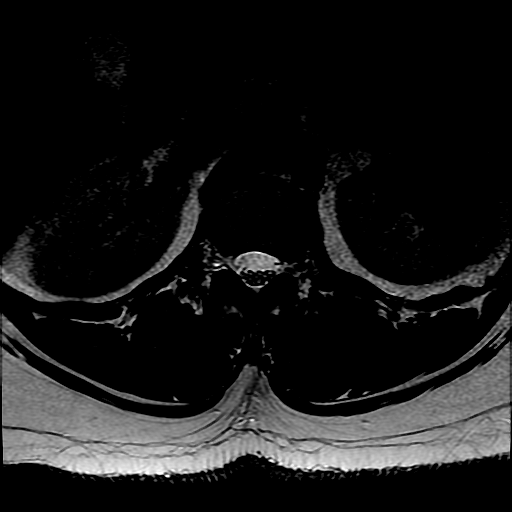

[Series 7: T1 · axial · 4.0mm · 0.39mm/px · z∈[-157,+11]mm · 3 of 39 slices shown (2 of 2)]
[im 6/39]
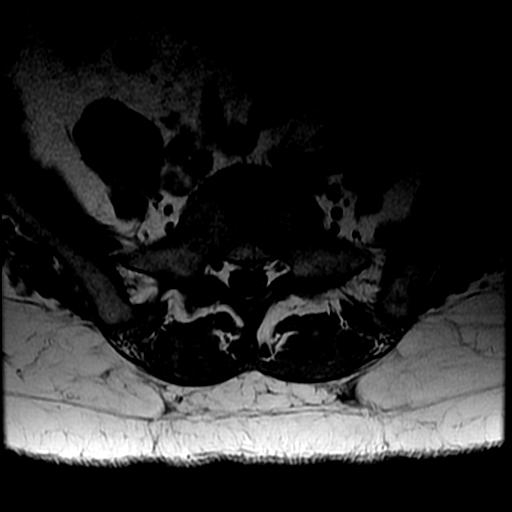
[im 21/39]
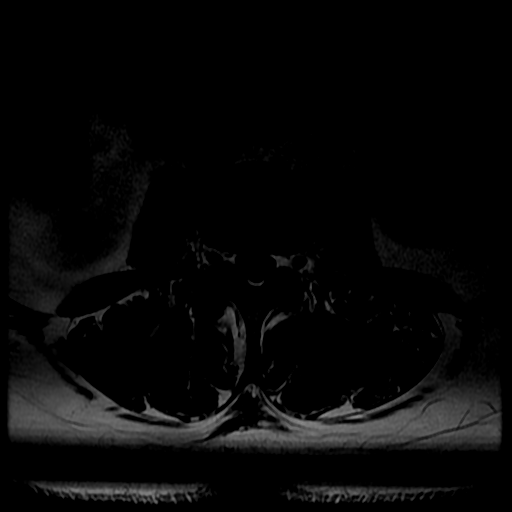
[im 33/39]
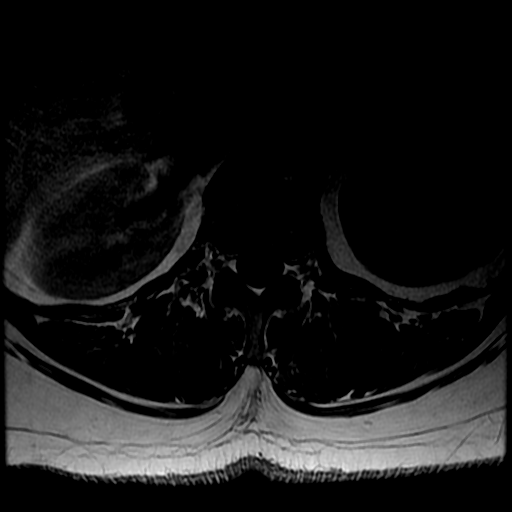

[18 of 48 positions shown; findings below may reference images not displayed]

FINDINGS: Normal signal is present in the conus medullaris which terminates at
T12 L1. A hemangioma is again noted on the right at L1. Chronic
endplate degenerative changes have progressed at L5-S1. Uterine
fibroids are evident within a retroflexed uterus. There is some
fluid within the endometrial cavity. Limited imaging of the abdomen
is otherwise unremarkable.

Limited imaging of the abdomen is unremarkable. There is no
significant adenopathy.

Mild to moderate facet hypertrophy is present at L1-2, L2-3, and
L3-4 without focal disc protrusion or stenosis.

L4-5: A broad-based disc protrusion is present. There is a central
annular tear. Mild facet hypertrophy is noted bilaterally. The
combination results in mild subarticular and foraminal narrowing
bilaterally.

L5-S1: A left paramedian disc protrusion results in moderate to
severe left subarticular stenosis. There is mild right subarticular
narrowing. Moderate left and mild right foraminal stenosis is also
present. The foraminal disease has also progressed since the prior
exam.
IMPRESSION: 1. Moderate to severe left subarticular stenosis at L5-S1, likely
impacting the left S1 nerve root due to a left paramedian disc
protrusion.
2. Moderate left and mild right foraminal narrowing at L5-S1 has
also progressed, potentially affecting the L5 nerve roots.
3. Mild subarticular and foraminal narrowing bilaterally at L4-5.
4. Multilevel moderate facet degenerative changes throughout the
lumbar spine as described.

## 2017-02-24 ENCOUNTER — Other Ambulatory Visit: Payer: Self-pay | Admitting: Family Medicine

## 2017-02-24 ENCOUNTER — Other Ambulatory Visit (HOSPITAL_COMMUNITY)
Admission: RE | Admit: 2017-02-24 | Discharge: 2017-02-24 | Disposition: A | Discharge status: Home / Self Care | Payer: BC Managed Care – PPO | Source: Ambulatory Visit | Attending: Family Medicine | Admitting: Family Medicine

## 2017-02-24 DIAGNOSIS — Z124 Encounter for screening for malignant neoplasm of cervix: Secondary | ICD-10-CM | POA: Diagnosis not present

## 2017-02-25 LAB — CYTOLOGY - PAP
Diagnosis: NEGATIVE
HPV: NOT DETECTED

## 2020-01-06 ENCOUNTER — Telehealth: Payer: Self-pay | Admitting: Hematology

## 2020-01-06 ENCOUNTER — Encounter: Payer: Self-pay | Admitting: *Deleted

## 2020-01-06 DIAGNOSIS — D0511 Intraductal carcinoma in situ of right breast: Secondary | ICD-10-CM

## 2020-01-06 NOTE — Telephone Encounter (Signed)
LVM for upcoming [facility] appointment in the afternoon at 1215, Ariel Andersen will send packet to patient

## 2020-01-06 NOTE — Telephone Encounter (Signed)
Spoke to patient to confirm afternoon appointment, explained how the appointment will go and labs and exam

## 2020-01-09 ENCOUNTER — Encounter: Payer: Self-pay | Admitting: Genetic Counselor

## 2020-01-11 ENCOUNTER — Encounter: Payer: Self-pay | Admitting: Physical Therapy

## 2020-01-11 ENCOUNTER — Inpatient Hospital Stay: Payer: BC Managed Care – PPO

## 2020-01-11 ENCOUNTER — Other Ambulatory Visit: Payer: Self-pay

## 2020-01-11 ENCOUNTER — Encounter: Payer: Self-pay | Admitting: Hematology

## 2020-01-11 ENCOUNTER — Ambulatory Visit (HOSPITAL_BASED_OUTPATIENT_CLINIC_OR_DEPARTMENT_OTHER): Payer: BC Managed Care – PPO | Admitting: Genetic Counselor

## 2020-01-11 ENCOUNTER — Inpatient Hospital Stay: Payer: BC Managed Care – PPO | Attending: Hematology | Admitting: Hematology

## 2020-01-11 ENCOUNTER — Encounter: Payer: Self-pay | Admitting: *Deleted

## 2020-01-11 ENCOUNTER — Encounter: Payer: Self-pay | Admitting: General Practice

## 2020-01-11 ENCOUNTER — Ambulatory Visit: Payer: BC Managed Care – PPO | Attending: General Surgery | Admitting: Physical Therapy

## 2020-01-11 ENCOUNTER — Ambulatory Visit
Admission: RE | Admit: 2020-01-11 | Discharge: 2020-01-11 | Disposition: A | Discharge status: Home / Self Care | Payer: BC Managed Care – PPO | Source: Ambulatory Visit | Attending: Radiation Oncology | Admitting: Radiation Oncology

## 2020-01-11 VITALS — BP 130/88 | HR 84 | Temp 97.8°F | Resp 18 | Ht 65.0 in | Wt 198.1 lb

## 2020-01-11 DIAGNOSIS — M1990 Unspecified osteoarthritis, unspecified site: Secondary | ICD-10-CM | POA: Diagnosis not present

## 2020-01-11 DIAGNOSIS — C50311 Malignant neoplasm of lower-inner quadrant of right female breast: Secondary | ICD-10-CM | POA: Diagnosis not present

## 2020-01-11 DIAGNOSIS — E119 Type 2 diabetes mellitus without complications: Secondary | ICD-10-CM | POA: Insufficient documentation

## 2020-01-11 DIAGNOSIS — G4730 Sleep apnea, unspecified: Secondary | ICD-10-CM | POA: Diagnosis not present

## 2020-01-11 DIAGNOSIS — D0511 Intraductal carcinoma in situ of right breast: Secondary | ICD-10-CM

## 2020-01-11 DIAGNOSIS — Z170 Estrogen receptor positive status [ER+]: Secondary | ICD-10-CM | POA: Insufficient documentation

## 2020-01-11 DIAGNOSIS — R293 Abnormal posture: Secondary | ICD-10-CM | POA: Diagnosis present

## 2020-01-11 DIAGNOSIS — I10 Essential (primary) hypertension: Secondary | ICD-10-CM

## 2020-01-11 DIAGNOSIS — D649 Anemia, unspecified: Secondary | ICD-10-CM | POA: Diagnosis not present

## 2020-01-11 DIAGNOSIS — Z800 Family history of malignant neoplasm of digestive organs: Secondary | ICD-10-CM | POA: Insufficient documentation

## 2020-01-11 DIAGNOSIS — Z79899 Other long term (current) drug therapy: Secondary | ICD-10-CM | POA: Insufficient documentation

## 2020-01-11 LAB — CMP (CANCER CENTER ONLY)
ALT: 25 U/L (ref 0–44)
AST: 22 U/L (ref 15–41)
Albumin: 3.6 g/dL (ref 3.5–5.0)
Alkaline Phosphatase: 61 U/L (ref 38–126)
Anion gap: 6 (ref 5–15)
BUN: 8 mg/dL (ref 6–20)
CO2: 30 mmol/L (ref 22–32)
Calcium: 9.7 mg/dL (ref 8.9–10.3)
Chloride: 102 mmol/L (ref 98–111)
Creatinine: 0.85 mg/dL (ref 0.44–1.00)
GFR, Est AFR Am: >60 mL/min (ref >60)
GFR, Estimated: >60 mL/min (ref >60)
Glucose, Bld: 185 mg/dL — ABNORMAL HIGH (ref 70–99)
Potassium: 3.4 mmol/L — ABNORMAL LOW (ref 3.5–5.1)
Sodium: 138 mmol/L (ref 135–145)
Total Bilirubin: 0.5 mg/dL (ref 0.3–1.2)
Total Protein: 6.8 g/dL (ref 6.5–8.1)

## 2020-01-11 LAB — CBC WITH DIFFERENTIAL (CANCER CENTER ONLY)
Abs Immature Granulocytes: 0.01 10*3/uL (ref 0.00–0.07)
Basophils Absolute: 0 10*3/uL (ref 0.0–0.1)
Basophils Relative: 1 %
Eosinophils Absolute: 0.2 10*3/uL (ref 0.0–0.5)
Eosinophils Relative: 4 %
HCT: 38 % (ref 36.0–46.0)
Hemoglobin: 11.6 g/dL — ABNORMAL LOW (ref 12.0–15.0)
Immature Granulocytes: 0 %
Lymphocytes Relative: 45 %
Lymphs Abs: 2.7 10*3/uL (ref 0.7–4.0)
MCH: 25.5 pg — ABNORMAL LOW (ref 26.0–34.0)
MCHC: 30.5 g/dL (ref 30.0–36.0)
MCV: 83.5 fL (ref 80.0–100.0)
Monocytes Absolute: 0.4 10*3/uL (ref 0.1–1.0)
Monocytes Relative: 7 %
Neutro Abs: 2.5 10*3/uL (ref 1.7–7.7)
Neutrophils Relative %: 43 %
Platelet Count: 206 10*3/uL (ref 150–400)
RBC: 4.55 MIL/uL (ref 3.87–5.11)
RDW: 14.5 % (ref 11.5–15.5)
WBC Count: 5.8 10*3/uL (ref 4.0–10.5)
nRBC: 0 % (ref 0.0–0.2)

## 2020-01-11 LAB — GENETIC SCREENING ORDER

## 2020-01-11 NOTE — Progress Notes (Signed)
Radiation Oncology         [phone] ________________________________  Name: KAMBRI DISMORE        MRN: [id number]  Date of Service: 01/11/2020 DOB: Aug 06, 1965  CC:Harlan Stains, MD  Jovita Kussmaul, MD     REFERRING PHYSICIAN: [name], MD   DIAGNOSIS: The encounter diagnosis was Ductal carcinoma in situ (DCIS) of right breast.   HISTORY OF PRESENT ILLNESS: Ariel Andersen is a 54 y.o. female seen in the multidisciplinary breast clinic for a new diagnosis of right breast cancer. The patient was noted to have screening detected calcifications in the right breast. Diagnostic imaging revealed a 4 cm group of calcifications in the right breast. She underwent stereotactic biopsy on 01/04/20 that revealed an intermediate grade DCIS involving a papillary lesion and with associated ductal hyperplasia. Her cancer was ER/PR positive. She's seen today to discuss options of treatment for her cancer.  PREVIOUS RADIATION THERAPY: No   PAST MEDICAL HISTORY:  Past Medical History:  Diagnosis Date  . Breast cancer ([place])   . Hypertension   . Seasonal allergies        PAST SURGICAL HISTORY: Past Surgical History:  Procedure Laterality Date  . BACK SURGERY  2017     FAMILY HISTORY:  Family History  Problem Relation Age of Onset  . Diabetes Mother   . Hyperlipidemia Mother   . Hypertension Mother   . Diabetes Father   . Hyperlipidemia Father   . Hypertension Father   . Stroke Father   . Pancreatic cancer Maternal Aunt      SOCIAL HISTORY:  reports that she has never smoked. She has never used smokeless tobacco. She reports that she does not drink alcohol and does not use drugs. The patient is married and works for [employer]. She lives in [place].    ALLERGIES: Patient has no known allergies.   MEDICATIONS:  Current Outpatient Medications  Medication Sig Dispense Refill  . amLODipine (NORVASC) 2.5 MG tablet Take 2.5 mg by mouth daily.    .  Aspirin 81 MG CAPS Take 1 capsule by mouth daily.    . Biotin 10000 MCG TABS Take 1 tablet by mouth daily.    . cholecalciferol (VITAMIN D) 1000 UNITS tablet Take 1,000 Units by mouth daily.    . ferrous sulfate 325 (65 FE) MG EC tablet Take 1 tablet by mouth daily.    . Multiple Vitamins-Minerals (MULTIVITAMIN WOMEN PO) Take 1 tablet by mouth daily.    Marland Kitchen triamcinolone cream (KENALOG) 0.1 % triamcinolone acetonide 0.1 % topical cream  APPLY 1 APPLICATION SPARINGLY TO AFFECTED AREA TWICE A DAY AS NEEDED FOR DRY SKIN EXTERNALLY     No current facility-administered medications for this encounter.     REVIEW OF SYSTEMS: On review of systems, the patient reports that she is doing well overall. She is trying to sort out what type of surgery she would like to proceed with. She denies any chest pain, shortness of breath, cough, fevers, chills, night sweats, unintended weight changes. She denies any bowel or bladder disturbances, and denies abdominal pain, nausea or vomiting. She denies any new musculoskeletal or joint aches or pains. A complete review of systems is obtained and is otherwise negative.     PHYSICAL EXAM:  Wt Readings from Last 3 Encounters:  01/11/20 198 lb 1.6 oz (89.9 kg)  03/18/14 207 lb (93.9 kg)  03/07/14 211 lb (95.7 kg)   Temp Readings from Last 3 Encounters:  01/11/20 97.8 F (36.6 C) (Tympanic)  03/22/15 97.9 F (36.6 C) (Oral)  03/18/14 98.4 F (36.9 C) (Oral)   BP Readings from Last 3 Encounters:  01/11/20 130/88  03/28/16 (!) 153/96  04/06/15 125/85   Pulse Readings from Last 3 Encounters:  01/11/20 84  03/28/16 71  04/06/15 70    In general this is a well appearing African American female in no acute distress. She's alert and oriented x4 and appropriate throughout the examination. Cardiopulmonary assessment is negative for acute distress and she exhibits normal effort. Bilateral breast exam is deferred.    ECOG = 0  0 - Asymptomatic (Fully active,  able to carry on all predisease activities without restriction)  1 - Symptomatic but completely ambulatory (Restricted in physically strenuous activity but ambulatory and able to carry out work of a light or sedentary nature. For example, light housework, office work)  2 - Symptomatic, <50% in bed during the day (Ambulatory and capable of all self care but unable to carry out any work activities. Up and about more than 50% of waking hours)  3 - Symptomatic, >50% in bed, but not bedbound (Capable of only limited self-care, confined to bed or chair 50% or more of waking hours)  4 - Bedbound (Completely disabled. Cannot carry on any self-care. Totally confined to bed or chair)  5 - Death   Eustace Pen MM, Creech RH, Tormey DC, et al. [id number]). "Toxicity and response criteria of the [employer]". [place] Oncol. 5 (6): 649-55    LABORATORY DATA:  Lab Results  Component Value Date   WBC 5.8 01/11/2020   HGB 11.6 (L) 01/11/2020   HCT 38.0 01/11/2020   MCV 83.5 01/11/2020   PLT 206 01/11/2020   Lab Results  Component Value Date   NA 138 01/11/2020   K 3.4 (L) 01/11/2020   CL 102 01/11/2020   CO2 30 01/11/2020   Lab Results  Component Value Date   ALT 25 01/11/2020   AST 22 01/11/2020   ALKPHOS 61 01/11/2020   BILITOT 0.5 01/11/2020      RADIOGRAPHY: No results found.     IMPRESSION/PLAN: 1. ER/PR positive, intermediate grade DCIS of the right breast. Dr. Lisbeth Renshaw discusses the pathology findings and reviews the nature of noninvasive breast disease. The consensus from the breast conference includes consideration of breast conservation with the possibility of needing reconstruction with mamopexy on the contralateral side to match due to the concerns of volume loss. If she conserves the breast, she would be a good candidate for external radiotherapy to the breast with breast conserving surgery followed by antiestrogen therapy. We discussed the risks, benefits, short,  and long term effects of radiotherapy, and the patient is interested in proceeding. Dr. Lisbeth Renshaw discusses the delivery and logistics of radiotherapy and anticipates a course of 6 1/2 weeks of radiotherapy if breast conservation is planned. We will see her back about 4 weeks after surgery to discuss the simulation process and anticipate we starting radiotherapy about 4-6 weeks after surgery.    In a visit lasting 60 minutes, greater than 50% of the time was spent face to face reviewing her case, as well as in preparation of, discussing, and coordinating the patient's care.  The above documentation reflects my direct findings during this shared patient visit. Please see the separate note by Dr. Lisbeth Renshaw on this date for the remainder of the patient's plan of care.    Carola Rhine, PAC

## 2020-01-11 NOTE — Progress Notes (Signed)
[facility] Breast Multidisciplinary [facility] Spiritual Care  Met with Ariel Andersen and her husband in Breast Multidisciplinary Clinic to introduce Support Center team/resources.  Also assessed for distress and other psychosocial needs.   Ariel Andersen report good support and are still processing Ariel implications of her diagnosis and treatment plan. They note that [facility] was very helpful in that process. Provided pastoral presence, empathic listening, and normalization of feelings.  Follow up needed: No. Ariel Andersen prefers to reach out to Team if needs or concerns arise.   Chaplain Lisa Lundeen, MDiv, [facility] Pager [phone] Voicemail [phone]      

## 2020-01-11 NOTE — Progress Notes (Signed)
[facility]   Telephone:([phone]) [phone] Fax:([phone]) [place] Note   Patient Care Team: Harlan Stains, MD as PCP - General (Family Medicine) Rockwell Germany, RN as Oncology Nurse Navigator Mauro Kaufmann, RN as Oncology Nurse Navigator Truitt Merle, MD as Consulting Physician (Hematology) Jovita Kussmaul, MD as Consulting Physician (General Surgery) Kyung Rudd, MD as Consulting Physician (Radiation Oncology)  Date of Service:  01/11/2020   CHIEF COMPLAINTS/PURPOSE OF CONSULTATION:  Newly Diagnosed right DCIS   Oncology History Overview Note  Cancer Staging No matching staging information was found for the patient.    Ductal carcinoma in situ (DCIS) of right breast  12/05/2019 Mammogram   Diagnostic Mammogram 12/05/19  IMPRESSION The 4cm span of segmental pleomorphic calcifications in the right breast lower inner aspect middle depth 7cm from the nipple are suspicious of malignancy.   01/04/2020 Initial Biopsy   Diagnosis  Breast, right, needle core biopsy, RLMQ, middle depth, 7cmfn -DUCTAL CARCINOMA IN SITU INVOLVING A PAPILLARY LESION -USUAL DUCTAL HYPERPLASIA AND COLUMNAR CELL CHANGE -SEE COMMENT   01/04/2020 Receptors her2   ER - 95% positive moderate staining  PR - 99% positive strong staining     01/06/2020 Initial Diagnosis   Ductal carcinoma in situ (DCIS) of right breast      HISTORY OF PRESENTING ILLNESS:  Ariel Andersen 54 y.o. female is a here because of newly diagnosed DCIS. The patient presents to the clinic today accompanied by her husband.  Her mass was found on screening mammogram. She did not feel the mass herself. She has had known right breast calcifications and has been having yearly mammograms. She denies nipple, breast or skin change or body change recently.   Socially she is married with 1 adult daughter. She does not drink alcohol and a non-smoker. She notes she is still working.   She has a PMHx of HTN and  pre-diabetic no on medication. She planned to have left knee surgery due to torn meniscus and arthritis. This has been postponed due to her recent breast cancer. She has surgery in 2017 on her lumbar spine. She has occasional residual back aches since her surgery. When she had tooth pulled she stopped breathing on anesthesia. She has sleep apnea. She has not had sleep study before. Her maternal aunt had pancreatic cancer. Her periods are still regular. I reviewed her medication list with her. Her initial colonoscopy at age 65 was normal per pt.    GYN HISTORY  Menarchal: 13 LMP: 8/12-8/18/2021 Contraceptive: 10 years  HRT: No G1P1: first at 59    REVIEW OF SYSTEMS:    Constitutional: Denies fevers, chills or abnormal night sweats Eyes: Denies blurriness of vision, double vision or watery eyes Ears, nose, mouth, throat, and face: Denies mucositis or sore throat Respiratory: Denies cough, dyspnea or wheezes Cardiovascular: Denies palpitation, chest discomfort or lower extremity swelling Gastrointestinal:  Denies nausea, heartburn or change in bowel habits Skin: Denies abnormal skin rashes Lymphatics: Denies new lymphadenopathy or easy bruising Neurological:Denies numbness, tingling or new weaknesses Behavioral/Psych: Mood is stable, no new changes  All other systems were reviewed with the patient and are negative.   MEDICAL HISTORY:  Past Medical History:  Diagnosis Date  . Breast cancer ([place])   . Hypertension   . Seasonal allergies     SURGICAL HISTORY: Past Surgical History:  Procedure Laterality Date  . BACK SURGERY  2017    SOCIAL HISTORY: Social History   Socioeconomic History  .  Marital status: Married    Spouse name: Not on file  . Number of children: Not on file  . Years of education: Not on file  . Highest education level: Not on file  Occupational History  . Not on file  Tobacco Use  . Smoking status: Never Smoker  . Smokeless tobacco: Never Used  Vaping  Use  . Vaping Use: Never used  Substance and Sexual Activity  . Alcohol use: No  . Drug use: No  . Sexual activity: Not on file  Other Topics Concern  . Not on file  Social History Narrative  . Not on file   Social Determinants of Health   Financial Resource Strain: Low Risk   . Difficulty of Paying Living Expenses: Not hard at all  Food Insecurity: No Food Insecurity  . Worried About Charity fundraiser in the Last Year: Never true  . Ran Out of Food in the Last Year: Never true  Transportation Needs: No Transportation Needs  . Lack of Transportation (Medical): No  . Lack of Transportation (Non-Medical): No  Physical Activity:   . Days of Exercise per Week: Not on file  . Minutes of Exercise per Session: Not on file  Stress:   . Feeling of Stress : Not on file  Social Connections:   . Frequency of Communication with Friends and Family: Not on file  . Frequency of Social Gatherings with Friends and Family: Not on file  . Attends Religious Services: Not on file  . Active Member of Clubs or Organizations: Not on file  . Attends Archivist Meetings: Not on file  . Marital Status: Not on file  Intimate Partner Violence:   . Fear of Current or Ex-Partner: Not on file  . Emotionally Abused: Not on file  . Physically Abused: Not on file  . Sexually Abused: Not on file    FAMILY HISTORY: Family History  Problem Relation Age of Onset  . Diabetes Mother   . Hyperlipidemia Mother   . Hypertension Mother   . Diabetes Father   . Hyperlipidemia Father   . Hypertension Father   . Stroke Father   . Pancreatic cancer Maternal Aunt     ALLERGIES:  has No Known Allergies.  MEDICATIONS:  Current Outpatient Medications  Medication Sig Dispense Refill  . Aspirin 81 MG CAPS Take 1 capsule by mouth daily.    . Biotin 10000 MCG TABS Take 1 tablet by mouth daily.    . Multiple Vitamins-Minerals (MULTIVITAMIN WOMEN PO) Take 1 tablet by mouth daily.    Marland Kitchen amLODipine  (NORVASC) 2.5 MG tablet Take 2.5 mg by mouth daily.    . cholecalciferol (VITAMIN D) 1000 UNITS tablet Take 1,000 Units by mouth daily.    . ferrous sulfate 325 (65 FE) MG EC tablet Take 1 tablet by mouth daily.    Marland Kitchen triamcinolone cream (KENALOG) 0.1 % triamcinolone acetonide 0.1 % topical cream  APPLY 1 APPLICATION SPARINGLY TO AFFECTED AREA TWICE A DAY AS NEEDED FOR DRY SKIN EXTERNALLY     No current facility-administered medications for this visit.    PHYSICAL EXAMINATION: ECOG PERFORMANCE STATUS: 0 - Asymptomatic  Vitals:   01/11/20 1313  BP: 130/88  Pulse: 84  Resp: 18  Temp: 97.8 F (36.6 C)  SpO2: 100%   Filed Weights   01/11/20 1313  Weight: 198 lb 1.6 oz (89.9 kg)    GENERAL:alert, no distress and comfortable SKIN: skin color, texture, turgor are normal, no rashes or  significant lesions EYES: normal, Conjunctiva are pink and non-injected, sclera clear  NECK: supple, thyroid normal size, non-tender, without nodularity LYMPH:  no palpable lymphadenopathy in the cervical, axillary  LUNGS: clear to auscultation and percussion with normal breathing effort HEART: regular rate & rhythm and no murmurs and no lower extremity edema ABDOMEN:abdomen soft, non-tender and normal bowel sounds Musculoskeletal:no cyanosis of digits and no clubbing  NEURO: alert & oriented x 3 with fluent speech, no focal motor/sensory deficits BREAST: (+) Skin ecchymosis of right breast at biopsy site with 4x4cm palpable mass at 10:00, tender (+) Scar in ILQ of right breast from prior lumpectomy. Left Breast exam benign.  LABORATORY DATA:  I have reviewed the data as listed CBC Latest Ref Rng & Units 01/11/2020 09/07/2013 10/10/2009  WBC 4.0 - 10.5 K/uL 5.8 6.7 6.8  Hemoglobin 12.0 - 15.0 g/dL 11.6(L) 12.5 11.7(L)  Hematocrit 36 - 46 % 38.0 38.8 35.7(L)  Platelets 150 - 400 K/uL 206 236 208    CMP Latest Ref Rng & Units 01/11/2020 09/07/2013 10/10/2009  Glucose 70 - 99 mg/dL 185(H) 81 72  BUN 6 -  20 mg/dL _0 Creatinine 0.44 - 1.00 mg/dL 0.85 0.72 0.78  Sodium 135 - 145 mmol/L 138 142 138  Potassium 3.5 - 5.1 mmol/L 3.4(L) 3.3(L) 3.8  Chloride 98 - 111 mmol/L 102 102 103  CO2 22 - 32 mmol/L 30 26 32  Calcium 8.9 - 10.3 mg/dL 9.7 9.5 9.2  Total Protein 6.5 - 8.1 g/dL 6.8 7.7 -  Total Bilirubin 0.3 - 1.2 mg/dL 0.5 0.2(L) -  Alkaline Phos 38 - 126 U/L 61 61 -  AST 15 - 41 U/L 22 24 -  ALT 0 - 44 U/L 25 21 -     RADIOGRAPHIC STUDIES: I have personally reviewed the radiological images as listed and agreed with the findings in the report. No results found.  ASSESSMENT & PLAN:  BRYLYNN HANSSEN is a 54 y.o. African American female with    1. Right breast DCIS, Intermediate grade II, ER+/PR+ -I discussed her breast imaging and needle biopsy results with patient and her family members in great detail. She has 4cm mass in her right breast and biopsy showed DCIS.  -She was seen by breast surgeon Dr. Marlou Starks today, who discussed right lumpectomy or mastectomy given her large calcification area. Either way she may have reconstruction surgery. She will think about it.  -Her DCIS will be cured by complete surgical resection. Any form of adjuvant therapy is preventive. -I reviewed her risk and treatment benefits using the Breast Cancer Nomogram from [facility]). Based on family, PMx and lifestyle she has a 14% risk of developing future breast cancer in the next 10 years. Her risk would drop to 6% with RT or Antiestrogen therapy alone. With both adjuvant treatments her risk would decrease to 3%.  -Given her strongly positive ER and PR, I do recommend antiestrogen therapy with Tamoxifen, which decrease her risk of future breast cancer by ~40-50%.   --The potential side effects, which includes but not limited to, hot flash, skin and vaginal dryness, slightly increased risk of cardiovascular disease and cataract, small risk of thrombosis and endometrial cancer, were  discussed with her in great details. She is interested  -She will likely benefit from breast radiation if she undergo lumpectomy to decrease the risk of breast cancer. She will discuss this further with Dr Lisbeth Renshaw today.  -We also discussed that biopsy may have  sampling limitation, we will review her surgical path, to see if she has any invasive carcinoma components. -We discussed breast cancer surveillance after she completes treatment, Including annual mammogram, breast exam every 6-12 months. -Her initial breast exam showed 4cm mass of her right breast. Lab reviewed today, CBC and CMP WNL except Hg 11.6, K 3.4, BG 185.    2. Anemia  -Her labs today show Hg 11.6.  -She did have mild anemia 10 years ago, but not may labs in between  -She still has regular periods, but no menorrhagia.     3. Comorbidities: HTN, Pre-diabetic, arthritis, Sleep Apnea  -She is on medication for HTN and has not required medication for diabetes.  -She has chronic arthritis. She has undergone lumbar spine surgery around 2017 and back pain now occasional. She planned to do left knee surgery in 01/2020 but this has been postponed given recent breast cancer diagnosis.  -I recommend sleep study to further evaluate her sleep apnea. She may need CPAP machine.    PLAN:  -Proceed with surgery (right lumpectomy or mastectomy) -F/u after Radiation    No orders of the defined types were placed in this encounter.   All questions were answered. The patient knows to call the clinic with any problems, questions or concerns. The total time spent in the appointment was 40 minutes.     Truitt Merle, MD 01/11/2020 9:00 PM  I, Joslyn Devon, am acting as scribe for Truitt Merle, MD.   I have reviewed the above documentation for accuracy and completeness, and I agree with the above.

## 2020-01-11 NOTE — Patient Instructions (Signed)

## 2020-01-11 NOTE — Therapy (Signed)
[place], [place], [address] Phone: [phone]   Fax:  [phone]  Physical Therapy Treatment  Patient Details  Name: Ariel Andersen MRN: [id number] Date of Birth: 11-29-1965 Referring Provider (PT): Dr. Autumn Messing   Encounter Date: 01/11/2020   PT End of Session - 01/11/20 2145    Visit Number 1    Number of Visits 2    Date for PT Re-Evaluation 03/07/20    PT Start Time 1319    PT Stop Time 1343    PT Time Calculation (min) 24 min    Activity Tolerance Patient tolerated treatment well    Behavior During Therapy [facility] for tasks assessed/performed           Past Medical History:  Diagnosis Date  . Breast cancer ([place])   . Hypertension   . Seasonal allergies     Past Surgical History:  Procedure Laterality Date  . BACK SURGERY  2017    There were no vitals filed for this visit.   Subjective Assessment - 01/11/20 2137    Subjective Patient reports she is here today to be seen by her medical team for her newly diagnosed right breast cancer.    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 The mass measures 4 cm and is located in the lower inner quadrant. It is ER/PR positive.    Patient Stated Goals Reduce lymphedema risk and learn post op shoulder ROM HEP    Currently in Pain? No/denies              [facility] PT Assessment - 01/11/20 0001      Assessment   Medical Diagnosis Right breast cancer    Referring Provider (PT) Dr. Autumn Messing    Onset Date/Surgical Date 12/05/19    Hand Dominance Right    Prior Therapy none      Precautions   Precautions Other (comment)    Precaution Comments active cancer      Restrictions   Weight Bearing Restrictions No      Balance Screen   Has the patient fallen in the past 6 months No    Has the patient had a decrease in activity level because of a fear of  falling?  No    Is the patient reluctant to leave their home because of a fear of falling?  No      Home Environment   Living Environment Private residence    Living Arrangements Spouse/significant other;Children   Husband and 18 y.o. daughter   Available Help at Discharge Family      Prior Function   Level of [place] Full time employment    Geologist, engineering Payable for [employer] She does not exercise due to knee pain; she is scheduled for a knee scope 02/06/2020      Cognition   Overall Cognitive Status Within Functional Limits for tasks assessed      Posture/Postural Control   Posture/Postural Control Postural limitations    Postural Limitations Rounded Shoulders;Forward head      ROM / Strength   AROM / PROM / Strength AROM;Strength      AROM   Overall AROM Comments Cervical AROM is WNL    AROM Assessment Site Shoulder    Right/Left Shoulder Right;Left    Right Shoulder Extension 54 Degrees  Right Shoulder Flexion 155 Degrees    Right Shoulder ABduction 165 Degrees    Right Shoulder Internal Rotation 60 Degrees    Right Shoulder External Rotation 89 Degrees    Left Shoulder Extension 57 Degrees    Left Shoulder Flexion 149 Degrees    Left Shoulder ABduction 157 Degrees    Left Shoulder Internal Rotation 61 Degrees    Left Shoulder External Rotation 67 Degrees      Strength   Overall Strength Within functional limits for tasks performed             LYMPHEDEMA/ONCOLOGY QUESTIONNAIRE - 01/11/20 0001      Type   Cancer Type Right breast cancer      Lymphedema Assessments   Lymphedema Assessments Upper extremities      Right Upper Extremity Lymphedema   10 cm Proximal to Olecranon Process 29.8 cm    Olecranon Process 24.8 cm    10 cm Proximal to Ulnar Styloid Process 23.3 cm    Just Proximal to Ulnar Styloid Process 16.1 cm    Across Hand at [employer] 20.5 cm    At [place] of 2nd Digit  6.2 cm      Left Upper Extremity Lymphedema   10 cm Proximal to Olecranon Process 32.1 cm    Olecranon Process 26 cm    10 cm Proximal to Ulnar Styloid Process 22.4 cm    Just Proximal to Ulnar Styloid Process 16.5 cm    Across Hand at [employer] 20.4 cm    At [place] of 2nd Digit 6.1 cm           L-DEX FLOWSHEETS - 01/11/20 2100      L-DEX LYMPHEDEMA SCREENING   Measurement Type Unilateral    L-DEX MEASUREMENT EXTREMITY Upper Extremity    POSITION  Standing    DOMINANT SIDE Right    At Risk Side Right    BASELINE SCORE (UNILATERAL) -5.9           The patient was assessed using the L-Dex machine today to produce a lymphedema index baseline score. The patient will be reassessed on a regular basis (typically every 3 months) to obtain new L-Dex scores. If the score is > 6.5 points away from his/her baseline score indicating onset of subclinical lymphedema, it will be recommended to wear a compression garment for 4 weeks, 12 hours per day and then be reassessed. If the score continues to be > 6.5 points from baseline at reassessment, we will initiate lymphedema treatment. Assessing in this manner has a 95% rate of preventing clinically significant lymphedema.    Katina Dung - 01/11/20 0001    Open a tight or new jar No difficulty    Do heavy household chores (wash walls, wash floors) Mild difficulty    Carry a shopping bag or briefcase No difficulty    Wash your back No difficulty    Use a knife to cut food No difficulty    Recreational activities in which you take some force or impact through your arm, shoulder, or hand (golf, hammering, tennis) Mild difficulty    During the past week, to what extent has your arm, shoulder or hand problem interfered with your normal social activities with family, friends, neighbors, or groups? Not at all    During the past week, to what extent has your arm, shoulder or hand problem limited your work or other regular daily activities Not at all     Arm, shoulder, or  hand pain. None    Tingling (pins and needles) in your arm, shoulder, or hand None    Difficulty Sleeping No difficulty    DASH Score 4.55 %                   Patient was instructed today in a home exercise program today for post op shoulder range of motion. These included active assist shoulder flexion in sitting, scapular retraction, wall walking with shoulder abduction, and hands behind head external rotation.  She was encouraged to do these twice a day, holding 3 seconds and repeating 5 times when permitted by her physician.           PT Education - 01/11/20 2144    Education Details Lymphedema risk reduction and post op shoulder ROM HEP    Person(s) Educated Patient;Spouse    Methods Explanation;Demonstration;Handout    Comprehension Returned demonstration;Verbalized understanding               PT Long Term Goals - 01/11/20 2150      PT LONG TERM GOAL #1   Title Patient will demonstrate she has regained full shoulder ROM and function post operatively compared to baselines.    Time 8    Period Weeks    Status New    Target Date 03/07/20           Breast Clinic Goals - 01/11/20 2150      Patient will be able to verbalize understanding of pertinent lymphedema risk reduction practices relevant to her diagnosis specifically related to skin care.   Time 1    Period Days    Status Achieved      Patient will be able to return demonstrate and/or verbalize understanding of the post-op home exercise program related to regaining shoulder range of motion.   Time 1    Period Days    Status Achieved      Patient will be able to verbalize understanding of the importance of attending the postoperative After Breast Cancer Class for further lymphedema risk reduction education and therapeutic exercise.   Time 1    Period Days    Status Achieved                Plan - 01/11/20 2146    Clinical Impression Statement Patient was diagnosed on  12/05/2019 with right intermediate grade DCIS. The mass measures 4 cm and is located in the lower inner quadrant. It is ER/PR positive. Her multidisciplinary medical team met prior to her assessments to determine a recommended treatment plan. She is planning to have a right mastectomy or lumpectomy and sentinel node biopsy followed by anti-estrogen therapy. She will benefit from a post op PT reassessment to determine needs. She will also benefit from L-Dex screenings every 3 months to detect subclinical lymphedema.    Stability/Clinical Decision Making Stable/Uncomplicated    Clinical Decision Making Low    Rehab Potential Excellent    PT Frequency --   Eval and 1 f/u visit   PT Treatment/Interventions ADLs/Self Care Home Management;Therapeutic exercise;Patient/family education    PT Next Visit Plan Will reassess 3-4 weeks post op to determine needs    PT Home Exercise Plan Post op shoulder ROM HEP    Consulted and Agree with Plan of Care Patient;Family member/caregiver    Family Member Consulted Husband           Patient will benefit from skilled therapeutic intervention in order to improve the following deficits and impairments:  Decreased knowledge of precautions, Impaired UE functional use, Pain, Decreased range of motion, Postural dysfunction  Visit Diagnosis: Malignant neoplasm of lower-inner quadrant of right breast of female, estrogen receptor positive ([place]) - Plan: PT plan of care cert/re-cert  Abnormal posture - Plan: PT plan of care cert/re-cert  Patient will follow up at outpatient cancer rehab 3-4 weeks following surgery.  If the patient requires physical therapy at that time, a specific plan will be dictated and sent to the referring physician for approval. The patient was educated today on appropriate basic range of motion exercises to begin post operatively and the importance of attending the After Breast Cancer class following surgery.  Patient was educated today on lymphedema  risk reduction practices as it pertains to recommendations that will benefit the patient immediately following surgery.  She verbalized good understanding.      Problem List Patient Active Problem List   Diagnosis Date Noted  . Ductal carcinoma in situ (DCIS) of right breast 01/06/2020   Annia Friendly, PT 01/11/20 9:55 PM  [place], [place], [address] Phone: [phone]   Fax:  [phone]  Name: Ariel Andersen MRN: [id number] Date of Birth: Mar 13, 1966

## 2020-01-12 ENCOUNTER — Encounter: Payer: Self-pay | Admitting: Genetic Counselor

## 2020-01-12 DIAGNOSIS — Z800 Family history of malignant neoplasm of digestive organs: Secondary | ICD-10-CM

## 2020-01-12 HISTORY — DX: Family history of malignant neoplasm of digestive organs: Z80.0

## 2020-01-12 NOTE — Progress Notes (Signed)
REFERRING PROVIDER: Truitt Merle, MD [address] [place],  [place] [address]  PRIMARY PROVIDER:  Harlan Stains, MD  PRIMARY REASON FOR VISIT:  1. Ductal carcinoma in situ (DCIS) of right breast   2. Family history of pancreatic cancer     HISTORY OF PRESENT ILLNESS:   Ms. Daniel, a 54 y.o. female, was seen during multidisciplinary for a [place] cancer genetics consultation at the request of Dr. Burr Medico due to a personal history of ductal carcinoma in situ (DCIS) and family history of pancreatic cancer.  Ms. Lahmann presents to clinic today with her husband to discuss the possibility of a hereditary predisposition to cancer, to discuss genetic testing, and to further clarify her future cancer risks, as well as potential cancer risks for family members.   In 2021, at the age of 66, Ms. Kersting was diagnosed with ductal carcinoma in situ (DCIS) of the right breast. The preliminary treatment plan includes surgery, consideration for radiation, and antiestrogens.   CANCER HISTORY:  Oncology History Overview Note  Cancer Staging No matching staging information was found for the patient.    Ductal carcinoma in situ (DCIS) of right breast  12/05/2019 Mammogram   Diagnostic Mammogram 12/05/19  IMPRESSION The 4cm span of segmental pleomorphic calcifications in the right breast lower inner aspect middle depth 7cm from the nipple are suspicious of malignancy.   01/04/2020 Initial Biopsy   Diagnosis  Breast, right, needle core biopsy, RLMQ, middle depth, 7cmfn -DUCTAL CARCINOMA IN SITU INVOLVING A PAPILLARY LESION -USUAL DUCTAL HYPERPLASIA AND COLUMNAR CELL CHANGE -SEE COMMENT   01/04/2020 Receptors her2   ER - 95% positive moderate staining  PR - 99% positive strong staining     01/06/2020 Initial Diagnosis   Ductal carcinoma in situ (DCIS) of right breast     RISK FACTORS:  Menarche was at age 66.  First live birth at age 38.  OCP use for approximately 10 years.  Ovaries  intact: yes.  Hysterectomy: no.  Menopausal status: premenopausal.  HRT use: 0 years. Colonoscopy: yes; patient reports most recent colonoscopy was in 2018. Mammogram within the last year: yes. Up to date with pelvic exams: yes.  Past Medical History:  Diagnosis Date  . Breast cancer ([place])   . Family history of pancreatic cancer 01/12/2020  . Hypertension   . Seasonal allergies     Past Surgical History:  Procedure Laterality Date  . BACK SURGERY  2017    Social History   Socioeconomic History  . Marital status: Married    Spouse name: Not on file  . Number of children: Not on file  . Years of education: Not on file  . Highest education level: Not on file  Occupational History  . Not on file  Tobacco Use  . Smoking status: Never Smoker  . Smokeless tobacco: Never Used  Vaping Use  . Vaping Use: Never used  Substance and Sexual Activity  . Alcohol use: No  . Drug use: No  . Sexual activity: Not on file  Other Topics Concern  . Not on file  Social History Narrative  . Not on file   Social Determinants of Health   Financial Resource Strain: Low Risk   . Difficulty of Paying Living Expenses: Not hard at all  Food Insecurity: No Food Insecurity  . Worried About Charity fundraiser in the Last Year: Never true  . Ran Out of Food in the Last Year: Never true  Transportation Needs: No Transportation Needs  .  Lack of Transportation (Medical): No  . Lack of Transportation (Non-Medical): No  Physical Activity:   . Days of Exercise per Week: Not on file  . Minutes of Exercise per Session: Not on file  Stress:   . Feeling of Stress : Not on file  Social Connections:   . Frequency of Communication with Friends and Family: Not on file  . Frequency of Social Gatherings with Friends and Family: Not on file  . Attends Religious Services: Not on file  . Active Member of Clubs or Organizations: Not on file  . Attends Archivist Meetings: Not on file  . Marital  Status: Not on file     FAMILY HISTORY:  We obtained a detailed, 4-generation family history.  Significant diagnoses are listed below: Family History  Problem Relation Age of Onset  . Diabetes Mother   . Hyperlipidemia Mother   . Hypertension Mother   . Diabetes Father   . Hyperlipidemia Father   . Hypertension Father   . Stroke Father   . Pancreatic cancer Maternal Aunt        dx late 67s      Ms. Faulkenberry has one daughter, Cindie Crumbly, who is 71 years old and does not have cancer.  Ms. Lucking has one brother, age 77, and one sister, age 19, both without a history of cancer.  Her mother is living at age 67.  Ms. Abernathy had one maternal aunt who had pancreatic/colon cancer diagnosed in late 68s and passed away in her 62s.  No other maternal family history of cancer was reported.  Ms. Freyre's father is living at age 55.  Ms. Hovanec reports a paternal great aunt with an unknown type of cancer.  No other paternal family history of cancer was reported.    Ms. Awe is unaware of previous family history of genetic testing for hereditary cancer risks. Patient's maternal ancestors are of African American descent, and paternal ancestors are of African American descent. There is no reported Ashkenazi Jewish ancestry. There is no known consanguinity.  GENETIC COUNSELING ASSESSMENT: Ms. Sgroi is a 54 y.o. female with a personal history of DCIS and a family history of pancreatic cancer which is somewhat suggestive of a hereditary cancer syndrome and predisposition to cancer given the presence of related cancers in multiple generations. We, therefore, discussed and recommended the following at today's visit.   DISCUSSION: We discussed that 5 - 10 % of breast is hereditary, with most cases of hereditary breast cancer associated with mutations in BRCA1 and BRCA2.  There are other genes that can be associated with hereditary breast cancer syndromes.  Type of cancer risk and level of risk are  gene-specific.  We discussed that testing is beneficial for several reasons including knowing how to follow individuals after completing their treatment, identifying whether potential treatment options would be beneficial, and understanding if other family members could be at risk for cancer and allowing them to undergo genetic testing.   We reviewed the characteristics, features and inheritance patterns of hereditary cancer syndromes. We also discussed genetic testing, including the appropriate family members to test, the process of testing, insurance coverage and turn-around-time for results. We discussed the implications of a negative, positive and/or variant of uncertain significant result. In order to get genetic test results in a timely manner so that Ms. Espey can use these genetic test results for surgical decisions, we recommended Ms. Mcbryar pursue genetic testing for the STAT Breast Cancer Panel. The STAT Breast Cancer Panel  offered by [facility] includes sequencing and rearrangement analysis for the following 9 genes:  ATM, BRCA1, BRCA2, CDH1, CHEK2, PALB2, PTEN, STK11 and TP53.  Once complete, we recommend Ms. Swanger pursue reflex genetic testing to a more comprehensive gene panel.   Ms. Brigandi  was offered a common hereditary cancer panel (48 genes) and an expanded pan-cancer panel (85 genes). Ms. Aguirre was informed of the benefits and limitations of each panel, including that expanded pan-cancer panels contain several preliminary evidence genes that do not have clear management guidelines at this point in time.  We also discussed that as the number of genes included on a panel increases, the chances of variants of uncertain significance increases.  After considering the benefits and limitations of each gene panel, Ms. Martinek  elected to have expanded pan-cancer through [facility].  The Multi-Cancer Panel offered by [facility] includes sequencing and/or deletion duplication testing of the  following 85 genes: AIP, ALK, APC, ATM, AXIN2,BAP1,  BARD1, BLM, BMPR1A, BRCA1, BRCA2, BRIP1, CASR, CDC73, CDH1, CDK4, CDKN1B, CDKN1C, CDKN2A (p14ARF), CDKN2A (p16INK4a), CEBPA, CHEK2, CTNNA1, DICER1, DIS3L2, EGFR (c.2369C>T, p.Thr790Met variant only), EPCAM (Deletion/duplication testing only), FH, FLCN, GATA2, GPC3, GREM1 (Promoter region deletion/duplication testing only), HOXB13 (c.251G>A, p.Gly84Glu), HRAS, KIT, MAX, MEN1, MET, MITF (c.952G>A, p.Glu318Lys variant only), MLH1, MSH2, MSH3, MSH6, MUTYH, NBN, NF1, NF2, NTHL1, PALB2, PDGFRA, PHOX2B, PMS2, POLD1, POLE, POT1, PRKAR1A, PTCH1, PTEN, RAD50, RAD51C, RAD51D, RB1, RECQL4, RET, RNF43, RUNX1, SDHAF2, SDHA (sequence changes only), SDHB, SDHC, SDHD, SMAD4, SMARCA4, SMARCB1, SMARCE1, STK11, SUFU, TERC, TERT, TMEM127, TP53, TSC1, TSC2, VHL, WRN and WT1.   Based on Ms. Dusing's personal history of DCIS and family history of pancreatic cancer, she meets medical criteria for genetic testing for high-penetrance breast cancer genes. Despite that she meets criteria, she may still have an out of pocket cost. We discussed that if her out of pocket cost for testing is over $100, the laboratory will call and confirm whether she wants to proceed with testing.  If the out of pocket cost of testing is less than $100 she will be billed by the genetic testing laboratory.     PLAN: After considering the risks, benefits, and limitations, Ms. Courtney provided informed consent to pursue genetic testing and the blood sample was sent to [facility] for analysis of the STAT Breast Cancer Panel and the Multi-Cancer Panel. Results should be available within approximately 10 days for the STAT Panel and approximately 3 weeks for the Multi-Cancer Panel, at which point they will be disclosed by telephone to Ms. Orrico, as will any additional recommendations warranted by these results. Ms. Covino will receive a summary of her genetic counseling visit and a copy of her  results once available. This information will also be available in Epic.   Lastly, we encouraged Ms. Arrellano to remain in contact with cancer genetics annually so that we can continuously update the family history and inform her of any changes in cancer genetics and testing that may be of benefit for this family.   Ms. Trevathan's questions were answered to her satisfaction today. Our contact information was provided should additional questions or concerns arise. Thank you for the referral and allowing [place] to share in the care of your patient.   Giankarlo Leamer M. Joette Catching, [email or web address] (P) [phone]  The patient was seen for a total of 20 minutes in televideo genetic counseling.  This patient was discussed with Drs. Magrinat, Lindi Adie and/or Burr Medico who agrees with the above.    _______________________________________________________________________ For [facility]  Staff:  Number of people involved in session: 1 Was an Intern/ student involved with case: no

## 2020-01-16 ENCOUNTER — Telehealth: Payer: Self-pay | Admitting: Genetic Counselor

## 2020-01-16 NOTE — Progress Notes (Signed)
Contacted by [employer] on January 16, 2020 that patient requested to cancel the genetic test.  Ariel Andersen was contacted by phone to discuss cancelling her testing. Ariel Andersen disclosed that she wished to cancel due to the out of pocket cost of the test.  We discussed patient assistance pay options and reviewed the benefits of testing.  Ariel Andersen decided to proceed with cancelling genetic testing.  She is aware to contact genetics if she wishes to proceed with genetic testing in the future.

## 2020-01-16 NOTE — Telephone Encounter (Signed)
Contacted by [employer] that patient requested to cancel the genetic test.  Called the patient to discuss cancelling her test.  Disclosed that she wished to cancel due to the out of pocket cost of the test.  We discussed patient assistance pay options and reviewed the benefits of testing.  Patient decided to proceed with cancelling genetic testing.

## 2020-01-18 NOTE — Progress Notes (Signed)
Nutrition  Patient identified by attending Breast Clinic on 01/11/2020.  Patient was given nutrition packet with RD contact information by nurse navigator.  Chart reviewed.  54 year old female with new diagnosis of breast cancer.  Planning lumpectomy vs mastectomy, radiation and antiestrogens.    Chart reviewed.   Ht: 66 inches Wt: 198 lb 1.6 oz  BMI : 32  Patient currently is not at risk of malnutrition.  Please consult RD if changes in nutritional status occur.    Bart Ashford B. Zenia Resides, [place], [place] Registered Dietitian [phone] (mobile)

## 2020-01-19 ENCOUNTER — Telehealth: Payer: Self-pay | Admitting: *Deleted

## 2020-01-19 ENCOUNTER — Encounter: Payer: Self-pay | Admitting: *Deleted

## 2020-01-19 NOTE — Telephone Encounter (Signed)
Spoke to pt concerning [place] from 8.25.21. Denies questions or concerns regarding dx or treatment care plan. Pt did relate she may want to have discussion regarding DIEP flap reconstruction with Dr. Morley Kos. She will call after her appt with Dr Marla Roe to let me know if she would like to proceed with that referral. Encourage pt to call with needs or further questions received verbal understanding.

## 2020-01-20 ENCOUNTER — Ambulatory Visit (INDEPENDENT_AMBULATORY_CARE_PROVIDER_SITE_OTHER): Payer: BC Managed Care – PPO | Admitting: Plastic Surgery

## 2020-01-20 ENCOUNTER — Encounter: Payer: Self-pay | Admitting: Plastic Surgery

## 2020-01-20 ENCOUNTER — Other Ambulatory Visit: Payer: Self-pay

## 2020-01-20 VITALS — BP 150/96 | HR 72 | Temp 98.3°F | Ht 65.0 in | Wt 198.0 lb

## 2020-01-20 DIAGNOSIS — D0511 Intraductal carcinoma in situ of right breast: Secondary | ICD-10-CM | POA: Diagnosis not present

## 2020-01-20 NOTE — Progress Notes (Addendum)
Patient ID: Ariel Andersen, female    DOB: 1965/08/31, 54 y.o.   MRN: [id number]   Chief Complaint  Patient presents with  . Advice Only    The patient is a 54 year old female here with her husband for a consultation for breast reconstruction.  She underwent a screening mammogram which was found to be abnormal and led to a biopsy.  She did not feel any mass herself she was diagnosed with ductal carcinoma in situ of RIGHT breast.  She is 5 feet 4 inches tall and weighs 197 pounds.  Her preoperative bra size is a 44 D.  She would like to be smaller.  She has grade 3 ptosis bilaterally.  She has hypertension, sleep apnea and prediabetes.  She has not had any abdominal surgery.  She is married and has daughter.  She does not drink or smoke.  She was planning on undergoing a left knee surgery but likely will postpone that.  She had lumbar spine surgery in 2017.  She has a family history of pancreatic cancer.   Review of Systems  Constitutional: Negative for activity change and appetite change.  HENT: Negative.   Eyes: Negative.   Respiratory: Negative.  Negative for chest tightness and shortness of breath.   Cardiovascular: Negative for chest pain.  Gastrointestinal: Negative.   Endocrine: Negative.   Genitourinary: Negative.   Musculoskeletal: Positive for back pain.  Neurological: Negative.   Psychiatric/Behavioral: Negative.     Past Medical History:  Diagnosis Date  . Breast cancer ([place])   . Family history of pancreatic cancer 01/12/2020  . Hypertension   . Seasonal allergies     Past Surgical History:  Procedure Laterality Date  . BACK SURGERY  2017      Current Outpatient Medications:  .  amLODipine (NORVASC) 2.5 MG tablet, Take 2.5 mg by mouth daily., Disp: , Rfl:  .  Aspirin 81 MG CAPS, Take 1 capsule by mouth daily., Disp: , Rfl:  .  Biotin 10000 MCG TABS, Take 1 tablet by mouth daily., Disp: , Rfl:  .  cholecalciferol (VITAMIN D) 1000 UNITS tablet, Take 1,000  Units by mouth daily., Disp: , Rfl:  .  ferrous sulfate 325 (65 FE) MG EC tablet, Take 1 tablet by mouth daily., Disp: , Rfl:  .  Multiple Vitamins-Minerals (MULTIVITAMIN WOMEN PO), Take 1 tablet by mouth daily., Disp: , Rfl:  .  triamcinolone cream (KENALOG) 0.1 %, triamcinolone acetonide 0.1 % topical cream  APPLY 1 APPLICATION SPARINGLY TO AFFECTED AREA TWICE A DAY AS NEEDED FOR DRY SKIN EXTERNALLY, Disp: , Rfl:    Objective:   Vitals:   01/20/20 0853  BP: (!) 150/96  Pulse: 72  Temp: 98.3 F (36.8 C)  SpO2: 98%    Physical Exam Vitals and nursing note reviewed.  Constitutional:      Appearance: Normal appearance.  HENT:     Head: Normocephalic and atraumatic.  Cardiovascular:     Rate and Rhythm: Normal rate.     Pulses: Normal pulses.  Pulmonary:     Effort: Pulmonary effort is normal. No respiratory distress.  Abdominal:     General: Abdomen is flat. There is no distension.     Tenderness: There is no abdominal tenderness.  Neurological:     General: No focal deficit present.     Mental Status: She is alert and oriented to person, place, and time.  Psychiatric:        Mood and Affect: Mood normal.  Behavior: Behavior normal.        Thought Content: Thought content normal.     Assessment & Plan:  Ductal carcinoma in situ (DCIS) of right breast  We had a detailed conversation about the patient's options for breast reconstruction. Several reconstruction options were explained to the patient.  It is important to remember that breast reconstruction is an optional procedure. Reconstruction often requires several stages of surgery and this means more than one operation.  The surgeries are often done several months apart.  The entire process from start to finish can take a year or more. The major goal of breast reconstruction is to look normal in clothing. There will always be scars and a difference noticeable without clothes.  This is true for asymmetries where both  breasts will not be identical.  Surgery may be needed or desired to the non-cancerous breast in order to achieve better symmetry and satisfactory results.  Regardless of the reconstructive method, there is always risks and the possibility that the procedure will fail or have complications.  This couls required additional surgeries.    We discussed the available methods of breast reconstruction and included:  1. Tissue expander with Acellular dermal matrix followed by implant based reconstruction. This can be done as one surgery or multiple surgeries.  2. Autologous reconstruction can include using a muscle or tissue from another area of the body for the reconstruction.  3. Combined procedures like the latissismus dorsi flaps that often uses the muscle with an expander or implant.  For each of the method discussed the risks, benefits, scars and recovery time were discussed in detail. Specific risks included bleeding, infection, hematoma, seroma, scarring, pain, wound healing complications, flap loss, fat necrosis, capsular contracture, need for implant removal, donor site complications, bulge, hernia, umbilical necrosis, need for urgent reoperation, and need for dressing changes.   After the options were discussed we focused on the patient's desires and the procedure that was best for her based on all the information.  A total of 45 minutes of face-to-face time was spent in this encounter, of which >70% was spent in counseling.   The patient is most interested in a Diep flap.  However she is also interested in partial mastectomy with bilateral reduction.  She is also considering the possibility of a right mastectomy with expander placement while she decreases her BMI to undergo the Diep flap.  I am going to send her the reconstruction brochure and set up a telemetry visit for 1 week.  This way she will have a chance to think things over.  I will also talk with Dr. Marlou Starks when he finishes his current case and  see what her options are for the possibility of a partial mastectomy. She is not a good candidate for salvaging her right nipple areola if she has a mastectomy due to the ptosis.  Pictures were obtained of the patient and placed in the chart with the patient's or guardian's permission.   [place], DO

## 2020-01-27 ENCOUNTER — Other Ambulatory Visit: Payer: Self-pay

## 2020-01-27 ENCOUNTER — Telehealth (INDEPENDENT_AMBULATORY_CARE_PROVIDER_SITE_OTHER): Payer: BC Managed Care – PPO | Admitting: Plastic Surgery

## 2020-01-27 DIAGNOSIS — D0511 Intraductal carcinoma in situ of right breast: Secondary | ICD-10-CM | POA: Diagnosis not present

## 2020-01-31 ENCOUNTER — Encounter: Payer: Self-pay | Admitting: *Deleted

## 2020-01-31 NOTE — Progress Notes (Signed)
The patient is a 54 year old female joining me by a phone visit. She had a screening mammogram done which detected abnormalities leading to a diagnosis of ductal carcinoma in situ of the right breast. Her preoperative bra size is a 44 D. I saw her a week ago when she had grade 3 ptosis bilaterally and history of hypertension, sleep apnea and prediabetes. She had lumbar spine surgery in 2017 and no abdominal surgery. Her original thoughts were for free flap surgery. She does not want to lose her nipple areola and realizes most likely would lose this if she had a mastectomy. She has given it thought over the past week and would like to go with oncologic breast reduction surgery with partial mastectomy. This is a reasonable option from a reconstructive standpoint and I will refer to Dr. Marlou Starks for the oncological part of this.  I connected with  Ariel Andersen on 01/31/20 by a phone and verified that I am speaking with the correct person using two identifiers.   I discussed the limitations of evaluation and management by telemedicine. The patient expressed understanding and agreed to proceed.  We spoke for 10 minutes.

## 2020-02-07 ENCOUNTER — Ambulatory Visit: Payer: Self-pay | Admitting: General Surgery

## 2020-02-07 DIAGNOSIS — D0511 Intraductal carcinoma in situ of right breast: Secondary | ICD-10-CM

## 2020-02-08 ENCOUNTER — Other Ambulatory Visit: Payer: Self-pay | Admitting: General Surgery

## 2020-02-08 DIAGNOSIS — D0511 Intraductal carcinoma in situ of right breast: Secondary | ICD-10-CM

## 2020-02-09 ENCOUNTER — Encounter: Payer: Self-pay | Admitting: *Deleted

## 2020-02-09 DIAGNOSIS — D0511 Intraductal carcinoma in situ of right breast: Secondary | ICD-10-CM

## 2020-02-14 NOTE — Progress Notes (Signed)
ICD-10-CM   1. Ductal carcinoma in situ (DCIS) of right breast  D05.11       Patient ID: Ariel Andersen, female    DOB: 10-14-1965, 54 y.o.   MRN: [id number]   History of Present Illness: Ariel Andersen is a 54 y.o.  female  with a history of right breast cancer.  She presents for preoperative evaluation for upcoming procedure, right lumpectomy with sentinel lymph node biopsy with Dr. Marlou Starks and bilateral oncoplastic breast reduction with Dr. Marla Roe, scheduled for 03/07/2020 .  Summary from previous visit: Patient has been diagnosed with ductal carcinoma in situ of right breast.  She is 5 feet 4 inches tall and weighs 197 pounds.  Her preoperative bra size is a 44D.  She would like to be smaller.  She has grade 3 ptosis bilaterally.  Job: Computer work  [place] Significant for: HTN, sleep apnea, prediabetes  The patient has not had problems with anesthesia.   Past Medical History: Allergies: No Known Allergies  Current Medications:  Current Outpatient Medications:  .  amLODipine (NORVASC) 2.5 MG tablet, Take 2.5 mg by mouth daily., Disp: , Rfl:  .  Aspirin 81 MG CAPS, Take 1 capsule by mouth daily., Disp: , Rfl:  .  atorvastatin (LIPITOR) 10 MG tablet, Take 10 mg by mouth daily., Disp: , Rfl:  .  Biotin 10000 MCG TABS, Take 1 tablet by mouth daily., Disp: , Rfl:  .  cholecalciferol (VITAMIN D) 1000 UNITS tablet, Take 1,000 Units by mouth daily., Disp: , Rfl:  .  ferrous sulfate 325 (65 FE) MG EC tablet, Take 1 tablet by mouth daily., Disp: , Rfl:  .  Multiple Vitamins-Minerals (MULTIVITAMIN WOMEN PO), Take 1 tablet by mouth daily., Disp: , Rfl:  .  triamcinolone cream (KENALOG) 0.1 %, triamcinolone acetonide 0.1 % topical cream  APPLY 1 APPLICATION SPARINGLY TO AFFECTED AREA TWICE A DAY AS NEEDED FOR DRY SKIN EXTERNALLY, Disp: , Rfl:   Past Medical Problems: Past Medical History:  Diagnosis Date  . Breast cancer ([place])   . Family history of pancreatic cancer 01/12/2020    . Hypertension   . Seasonal allergies     Past Surgical History: Past Surgical History:  Procedure Laterality Date  . BACK SURGERY  2017    Social History: Social History   Socioeconomic History  . Marital status: Married    Spouse name: Not on file  . Number of children: Not on file  . Years of education: Not on file  . Highest education level: Not on file  Occupational History  . Not on file  Tobacco Use  . Smoking status: Never Smoker  . Smokeless tobacco: Never Used  Vaping Use  . Vaping Use: Never used  Substance and Sexual Activity  . Alcohol use: No  . Drug use: No  . Sexual activity: Not on file  Other Topics Concern  . Not on file  Social History Narrative  . Not on file   Social Determinants of Health   Financial Resource Strain: Low Risk   . Difficulty of Paying Living Expenses: Not hard at all  Food Insecurity: No Food Insecurity  . Worried About Charity fundraiser in the Last Year: Never true  . Ran Out of Food in the Last Year: Never true  Transportation Needs: No Transportation Needs  . Lack of Transportation (Medical): No  . Lack of Transportation (Non-Medical): No  Physical Activity:   . Days of Exercise per Week:  Not on file  . Minutes of Exercise per Session: Not on file  Stress:   . Feeling of Stress : Not on file  Social Connections:   . Frequency of Communication with Friends and Family: Not on file  . Frequency of Social Gatherings with Friends and Family: Not on file  . Attends Religious Services: Not on file  . Active Member of Clubs or Organizations: Not on file  . Attends Archivist Meetings: Not on file  . Marital Status: Not on file  Intimate Partner Violence:   . Fear of Current or Ex-Partner: Not on file  . Emotionally Abused: Not on file  . Physically Abused: Not on file  . Sexually Abused: Not on file    Family History: Family History  Problem Relation Age of Onset  . Diabetes Mother   . Hyperlipidemia  Mother   . Hypertension Mother   . Diabetes Father   . Hyperlipidemia Father   . Hypertension Father   . Stroke Father   . Pancreatic cancer Maternal Aunt        dx late 28s    Review of Systems: Review of Systems  Constitutional: Negative for chills and fever.  HENT: Negative for congestion and sore throat.   Respiratory: Negative for cough and shortness of breath.   Cardiovascular: Negative for chest pain and palpitations.  Gastrointestinal: Negative for abdominal pain, nausea and vomiting.  Skin: Negative for itching and rash.    Physical Exam: Vital Signs BP (!) 155/98 (BP Location: Right Arm, Patient Position: Sitting, Cuff Size: Large)   Pulse 85   Temp 98.4 F (36.9 C) (Oral)   Ht 5\' 4"  (1.626 m)   Wt 194 lb (88 kg)   SpO2 98%   BMI 33.30 kg/m  Physical Exam Vitals and nursing note reviewed.  Constitutional:      General: She is not in acute distress.    Appearance: Normal appearance. She is not ill-appearing.  HENT:     Head: Normocephalic and atraumatic.  Eyes:     Extraocular Movements: Extraocular movements intact.  Cardiovascular:     Rate and Rhythm: Normal rate and regular rhythm.     Pulses: Normal pulses.     Heart sounds: Normal heart sounds.  Pulmonary:     Effort: Pulmonary effort is normal.     Breath sounds: Normal breath sounds. No wheezing, rhonchi or rales.  Abdominal:     General: Bowel sounds are normal.     Palpations: Abdomen is soft.  Musculoskeletal:        General: No swelling. Normal range of motion.     Cervical back: Normal range of motion.  Skin:    General: Skin is warm and dry.     Coloration: Skin is not pale.     Findings: No erythema or rash.  Neurological:     General: No focal deficit present.     Mental Status: She is alert and oriented to person, place, and time.  Psychiatric:        Mood and Affect: Mood normal.        Behavior: Behavior normal.        Thought Content: Thought content normal.        Judgment:  Judgment normal.     Assessment/Plan:  [name] scheduled for right breast lumpectomy with sentinel lymph node biopsy with Dr. Marlou Starks and oncoplastic bilateral breast reduction with Dr. Marla Roe .  Risks, benefits, and alternatives of procedure discussed, questions  answered and consent obtained.    Smoking Status: non-smoker; Counseling Given? N/A Last Mammogram: 12/05/2019; Results: Right breast: Suspicious for malignancy, left breast: No findings suspicious for malignancy  Caprini Score: 6 High; Risk Factors include: 54 year old female, Hx cancer, BMI > 25, and length of planned surgery. Recommendation for mechanical and pharmacological prophylaxis during surgery. Encourage early ambulation.   Pictures obtained: 01/20/20  Post-op Rx sent to pharmacy: Norco, Zofran, Keflex, Ibu, Tylenol  Patient was provided with the breast reduction risks and General Surgical Risk consent document and Pain Medication Agreement prior to their appointment.  They had adequate time to read through the risk consent documents and Pain Medication Agreement. We also discussed them in person together during this preop appointment. All of their questions were answered to their satisfaction.  Recommended calling if they have any further questions.  Risk consent form and Pain Medication Agreement to be scanned into patient's chart.  The risk that can be encountered with breast reduction were discussed and include the following but not limited to these:  Breast asymmetry, fluid accumulation, firmness of the breast, inability to breast feed, loss of nipple or areola, skin loss, decrease or no nipple sensation, fat necrosis of the breast tissue, bleeding, infection, healing delay.  There are risks of anesthesia, changes to skin sensation and injury to nerves or blood vessels.  The muscle can be temporarily or permanently injured.  You may have an allergic reaction to tape, suture, glue, blood products which can result in skin  discoloration, swelling, pain, skin lesions, poor healing.  Any of these can lead to the need for revisonal surgery or stage procedures.  A reduction has potential to interfere with diagnostic procedures.  Nipple or breast piercing can increase risks of infection.  This procedure is best done when the breast is fully developed.  Changes in the breast will continue to occur over time.  Pregnancy can alter the outcomes of previous breast reduction surgery, weight gain and weigh loss can also effect the long term appearance.   The [place] was signed into law in 2016 which includes the topic of electronic health records.  This provides immediate access to information in MyChart.  This includes consultation notes, operative notes, office notes, lab results and pathology reports.  If you have any questions about what you read please let us know at your next visit or call us at the office.  We are right here with you.   Electronically signed by: Threasa Heads, PA-C 02/16/2020 5:04 PM

## 2020-02-16 ENCOUNTER — Other Ambulatory Visit: Payer: Self-pay

## 2020-02-16 ENCOUNTER — Ambulatory Visit (INDEPENDENT_AMBULATORY_CARE_PROVIDER_SITE_OTHER): Payer: BC Managed Care – PPO | Admitting: Plastic Surgery

## 2020-02-16 ENCOUNTER — Encounter: Payer: Self-pay | Admitting: Plastic Surgery

## 2020-02-16 VITALS — BP 155/98 | HR 85 | Temp 98.4°F | Ht 64.0 in | Wt 194.0 lb

## 2020-02-16 DIAGNOSIS — D0511 Intraductal carcinoma in situ of right breast: Secondary | ICD-10-CM

## 2020-02-16 MED ORDER — IBUPROFEN 600 MG PO TABS: 600.0000 mg | ORAL_TABLET | Freq: Four times a day (QID) | ORAL | 0 refills | Status: DC | PRN

## 2020-02-16 MED ORDER — ACETAMINOPHEN 500 MG PO TABS: 500.0000 mg | ORAL_TABLET | Freq: Four times a day (QID) | ORAL | 0 refills | Status: DC | PRN

## 2020-02-16 MED ORDER — HYDROCODONE-ACETAMINOPHEN 5-325 MG PO TABS: 1.0000 | ORAL_TABLET | Freq: Three times a day (TID) | ORAL | 0 refills | Status: AC | PRN

## 2020-02-16 MED ORDER — ONDANSETRON HCL 4 MG PO TABS: 4.0000 mg | ORAL_TABLET | Freq: Three times a day (TID) | ORAL | 0 refills | Status: DC | PRN

## 2020-02-16 MED ORDER — CEPHALEXIN 500 MG PO CAPS: 500.0000 mg | ORAL_CAPSULE | Freq: Four times a day (QID) | ORAL | 0 refills | Status: AC

## 2020-02-21 DIAGNOSIS — Z719 Counseling, unspecified: Secondary | ICD-10-CM

## 2020-02-29 NOTE — Progress Notes (Signed)
[facility] ([place]), [place] - [address] [place] [address] Phone: [phone] Fax: [phone]  [id number] - [place], [place] [facility] [place] [address] Phone: [phone] Fax: [phone]  [facility] [address] [place] - [address] [place] [place] [address] Phone: [phone] Fax: [place], [place] [place] [address] Phone: [phone] Fax: [phone]      Your procedure is scheduled on October 20  Report to [facility] Main Entrance "A" at 1100 A.M., and check in at the Admitting office.  Call this number if you have problems the morning of surgery:  [phone]  Call [phone] if you have any questions prior to your surgery date Monday-Friday 8am-4pm    Remember:  Do not eat after midnight the night before your surgery  You may drink clear liquids until 1000 am the morning of your surgery.   Clear liquids allowed are: Water, Non-Citrus Juices (without pulp), Carbonated Beverages, Clear Tea, Black Coffee Only, and Gatorade    Take these medicines the morning of surgery with A SIP OF WATER  amLODipine (NORVASC) atorvastatin (LIPITOR)  Follow your surgeon's instructions on when to stop Aspirin.  If no instructions were given by your surgeon then you will need to call the office to get those instructions.    As of today, STOP taking any Aspirin (unless otherwise instructed by your surgeon) Aleve, Naproxen, Ibuprofen, Motrin, Advil, Goody's, BC's, all herbal medications, fish oil, and all vitamins.                      Do not wear jewelry, make up, or nail polish            Do not wear lotions, powders, perfumes, or deodorant.            Do not shave 48 hours prior to surgery.              Do not bring valuables to the  hospital.            [facility] is not responsible for any belongings or valuables.  Do NOT Smoke (Tobacco/Vaping) or drink Alcohol 24 hours prior to your procedure If you use a CPAP at night, you may bring all equipment for your overnight stay.   Contacts, glasses, dentures or bridgework may not be worn into surgery.      For patients admitted to the hospital, discharge time will be determined by your treatment team.   Patients discharged the day of surgery will not be allowed to drive home, and someone needs to stay with them for 24 hours.    Special instructions:   - Preparing For Surgery  Before surgery, you can play an important role. Because skin is not sterile, your skin needs to be as free of germs as possible. You can reduce the number of germs on your skin by washing with CHG (chlorahexidine gluconate) Soap before surgery.  CHG is an antiseptic cleaner which kills germs and bonds with the skin to continue killing germs even after washing.    Oral Hygiene is also important to reduce your risk of infection.  Remember - BRUSH YOUR TEETH THE MORNING OF SURGERY WITH YOUR REGULAR TOOTHPASTE  Please do not use if you have an allergy to CHG or antibacterial soaps. If  your skin becomes reddened/irritated stop using the CHG.  Do not shave (including legs and underarms) for at least 48 hours prior to first CHG shower. It is OK to shave your face.  Please follow these instructions carefully.   1. Shower the NIGHT BEFORE SURGERY and the MORNING OF SURGERY with CHG Soap.   2. If you chose to wash your hair, wash your hair first as usual with your normal shampoo.  3. After you shampoo, rinse your hair and body thoroughly to remove the shampoo.  4. Use CHG as you would any other liquid soap. You can apply CHG directly to the skin and wash gently with a scrungie or a clean washcloth.   5. Apply the CHG Soap to your body ONLY FROM THE NECK DOWN.  Do not use on open wounds or  open sores. Avoid contact with your eyes, ears, mouth and genitals (private parts). Wash Face and genitals (private parts)  with your normal soap.   6. Wash thoroughly, paying special attention to the area where your surgery will be performed.  7. Thoroughly rinse your body with warm water from the neck down.  8. DO NOT shower/wash with your normal soap after using and rinsing off the CHG Soap.  9. Pat yourself dry with a CLEAN TOWEL.  10. Wear CLEAN PAJAMAS to bed the night before surgery  11. Place CLEAN SHEETS on your bed the night of your first shower and DO NOT SLEEP WITH PETS.   Day of Surgery: Wear Clean/Comfortable clothing the morning of surgery Do not apply any deodorants/lotions.   Remember to brush your teeth WITH YOUR REGULAR TOOTHPASTE.   Please read over the following fact sheets that you were given.

## 2020-03-01 ENCOUNTER — Encounter (HOSPITAL_COMMUNITY): Payer: Self-pay

## 2020-03-01 ENCOUNTER — Encounter (HOSPITAL_COMMUNITY)
Admission: RE | Admit: 2020-03-01 | Discharge: 2020-03-01 | Disposition: A | Discharge status: Home / Self Care | Payer: BC Managed Care – PPO | Source: Ambulatory Visit | Attending: General Surgery | Admitting: General Surgery

## 2020-03-01 ENCOUNTER — Other Ambulatory Visit: Payer: Self-pay

## 2020-03-01 DIAGNOSIS — Z01818 Encounter for other preprocedural examination: Secondary | ICD-10-CM | POA: Diagnosis not present

## 2020-03-01 DIAGNOSIS — I10 Essential (primary) hypertension: Secondary | ICD-10-CM | POA: Insufficient documentation

## 2020-03-01 HISTORY — DX: Unilateral primary osteoarthritis, left knee: M17.12

## 2020-03-01 HISTORY — DX: Prediabetes: R73.03

## 2020-03-01 LAB — BASIC METABOLIC PANEL
Anion gap: 8 (ref 5–15)
BUN: 7 mg/dL (ref 6–20)
CO2: 31 mmol/L (ref 22–32)
Calcium: 9.5 mg/dL (ref 8.9–10.3)
Chloride: 101 mmol/L (ref 98–111)
Creatinine, Ser: 0.73 mg/dL (ref 0.44–1.00)
GFR, Estimated: >60 mL/min (ref >60)
Glucose, Bld: 143 mg/dL — ABNORMAL HIGH (ref 70–99)
Potassium: 3.2 mmol/L — ABNORMAL LOW (ref 3.5–5.1)
Sodium: 140 mmol/L (ref 135–145)

## 2020-03-01 LAB — CBC
HCT: 42.7 % (ref 36.0–46.0)
Hemoglobin: 12.7 g/dL (ref 12.0–15.0)
MCH: 25 pg — ABNORMAL LOW (ref 26.0–34.0)
MCHC: 29.7 g/dL — ABNORMAL LOW (ref 30.0–36.0)
MCV: 84.2 fL (ref 80.0–100.0)
Platelets: 214 10*3/uL (ref 150–400)
RBC: 5.07 MIL/uL (ref 3.87–5.11)
RDW: 14.8 % (ref 11.5–15.5)
WBC: 5.2 10*3/uL (ref 4.0–10.5)
nRBC: 0 % (ref 0.0–0.2)

## 2020-03-01 LAB — GLUCOSE, CAPILLARY: Glucose-Capillary: 158 mg/dL — ABNORMAL HIGH (ref 70–99)

## 2020-03-01 LAB — POCT PREGNANCY, URINE: Preg Test, Ur: NEGATIVE

## 2020-03-01 NOTE — Progress Notes (Signed)
PCP - Dr. Harlan Stains Cardiologist - denies  PPM/ICD - denies  Chest x-ray - N/A EKG - 03/01/2020 Stress Test - denies  ECHO - denies Cardiac Cath - denies  Sleep Study - denies CPAP - denies  DM: per patient, "pre-diabetic, checks blood sugar about 3 times a month, highest reading about 145"  Blood Thinner Instructions: N/A Aspirin Instructions: per patient last dose 02/26/2020  ERAS Protcol - Yes PRE-SURGERY Ensure or G2- None ordered  COVID TEST- Scheduled for 03/03/2020. Patient verbalized understanding of self-quarantine instructions, appointment time and place.  Anesthesia review: YES, seed placement scheduled for 03/01/2020. Records/Labs requested from PCP  Patient denies shortness of breath, fever, cough and chest pain at PAT appointment  All instructions explained to the patient, with a verbal understanding of the material. Patient agrees to go over the instructions while at home for a better understanding. Patient also instructed to self quarantine after being tested for COVID-19. The opportunity to ask questions was provided.

## 2020-03-03 ENCOUNTER — Other Ambulatory Visit (HOSPITAL_COMMUNITY)
Admission: RE | Admit: 2020-03-03 | Discharge: 2020-03-03 | Disposition: A | Discharge status: Home / Self Care | Payer: BC Managed Care – PPO | Source: Ambulatory Visit | Attending: General Surgery | Admitting: General Surgery

## 2020-03-03 DIAGNOSIS — Z20822 Contact with and (suspected) exposure to covid-19: Secondary | ICD-10-CM | POA: Insufficient documentation

## 2020-03-03 DIAGNOSIS — Z01812 Encounter for preprocedural laboratory examination: Secondary | ICD-10-CM | POA: Diagnosis not present

## 2020-03-03 LAB — SARS CORONAVIRUS 2 (TAT 6-24 HRS): SARS Coronavirus 2: NEGATIVE

## 2020-03-07 ENCOUNTER — Other Ambulatory Visit: Payer: Self-pay

## 2020-03-07 ENCOUNTER — Observation Stay (HOSPITAL_COMMUNITY)
Admission: RE | Admit: 2020-03-07 | Discharge: 2020-03-07 | Disposition: A | Discharge status: Home / Self Care | Payer: BC Managed Care – PPO | Attending: Plastic Surgery | Admitting: Plastic Surgery

## 2020-03-07 ENCOUNTER — Encounter (HOSPITAL_COMMUNITY)
Admission: RE | Disposition: A | Discharge status: Home / Self Care | Payer: Self-pay | Source: Home / Self Care | Attending: General Surgery

## 2020-03-07 ENCOUNTER — Ambulatory Visit (HOSPITAL_COMMUNITY): Payer: BC Managed Care – PPO | Admitting: Anesthesiology

## 2020-03-07 ENCOUNTER — Ambulatory Visit (HOSPITAL_COMMUNITY)
Admission: RE | Admit: 2020-03-07 | Discharge: 2020-03-07 | Disposition: A | Discharge status: Home / Self Care | Payer: BC Managed Care – PPO | Source: Ambulatory Visit | Attending: General Surgery | Admitting: General Surgery

## 2020-03-07 ENCOUNTER — Ambulatory Visit (HOSPITAL_COMMUNITY): Payer: BC Managed Care – PPO | Admitting: Physician Assistant

## 2020-03-07 ENCOUNTER — Encounter (HOSPITAL_COMMUNITY): Payer: Self-pay | Admitting: General Surgery

## 2020-03-07 DIAGNOSIS — C50919 Malignant neoplasm of unspecified site of unspecified female breast: Secondary | ICD-10-CM | POA: Diagnosis present

## 2020-03-07 DIAGNOSIS — D0511 Intraductal carcinoma in situ of right breast: Secondary | ICD-10-CM | POA: Diagnosis not present

## 2020-03-07 HISTORY — PX: SENTINEL NODE BIOPSY: SHX6608

## 2020-03-07 HISTORY — PX: BREAST REDUCTION SURGERY: SHX8

## 2020-03-07 HISTORY — PX: BREAST LUMPECTOMY WITH RADIOACTIVE SEED AND SENTINEL LYMPH NODE BIOPSY: SHX6550

## 2020-03-07 LAB — GLUCOSE, CAPILLARY
Glucose-Capillary: 111 mg/dL — ABNORMAL HIGH (ref 70–99)
Glucose-Capillary: 127 mg/dL — ABNORMAL HIGH (ref 70–99)

## 2020-03-07 SURGERY — BREAST LUMPECTOMY WITH RADIOACTIVE SEED AND SENTINEL LYMPH NODE BIOPSY
Anesthesia: General | Site: Breast | Laterality: Right

## 2020-03-07 MED ORDER — MIDAZOLAM HCL 2 MG/2ML IJ SOLN
INTRAMUSCULAR | Status: AC
  Filled 2020-03-07: qty 2

## 2020-03-07 MED ORDER — LIDOCAINE HCL 1 % IJ SOLN
INTRAMUSCULAR | Status: AC
  Filled 2020-03-07: qty 20

## 2020-03-07 MED ORDER — ACETAMINOPHEN 500 MG PO TABS
1000.0000 mg | ORAL_TABLET | ORAL | Status: AC
  Administered 2020-03-07: 1000 mg via ORAL
  Filled 2020-03-07: qty 2

## 2020-03-07 MED ORDER — SCOPOLAMINE 1 MG/3DAYS TD PT72
1.0000 | MEDICATED_PATCH | TRANSDERMAL | Status: DC
  Administered 2020-03-07: 1.5 mg via TRANSDERMAL
  Filled 2020-03-07: qty 1

## 2020-03-07 MED ORDER — DEXAMETHASONE SODIUM PHOSPHATE 10 MG/ML IJ SOLN
INTRAMUSCULAR | Status: DC | PRN
  Administered 2020-03-07: 10 mg via INTRAVENOUS

## 2020-03-07 MED ORDER — GABAPENTIN 300 MG PO CAPS
300.0000 mg | ORAL_CAPSULE | ORAL | Status: AC
  Administered 2020-03-07: 300 mg via ORAL
  Filled 2020-03-07: qty 1

## 2020-03-07 MED ORDER — SODIUM CHLORIDE 0.9% FLUSH: 3.0000 mL | INTRAVENOUS | Status: DC | PRN

## 2020-03-07 MED ORDER — ACETAMINOPHEN 325 MG PO TABS: 650.0000 mg | ORAL_TABLET | ORAL | Status: DC | PRN

## 2020-03-07 MED ORDER — PROPOFOL 10 MG/ML IV BOLUS
INTRAVENOUS | Status: AC
  Filled 2020-03-07: qty 20

## 2020-03-07 MED ORDER — CHLORHEXIDINE GLUCONATE CLOTH 2 % EX PADS: 6.0000 | MEDICATED_PAD | Freq: Once | CUTANEOUS | Status: DC

## 2020-03-07 MED ORDER — ONDANSETRON HCL 4 MG/2ML IJ SOLN
INTRAMUSCULAR | Status: AC
  Filled 2020-03-07: qty 6

## 2020-03-07 MED ORDER — HYDROMORPHONE HCL 1 MG/ML IJ SOLN
0.2500 mg | INTRAMUSCULAR | Status: DC | PRN
  Administered 2020-03-07: 0.5 mg via INTRAVENOUS

## 2020-03-07 MED ORDER — FENTANYL CITRATE (PF) 250 MCG/5ML IJ SOLN
INTRAMUSCULAR | Status: AC
  Filled 2020-03-07: qty 5

## 2020-03-07 MED ORDER — DIPHENHYDRAMINE HCL 50 MG/ML IJ SOLN: 12.5000 mg | Freq: Four times a day (QID) | INTRAMUSCULAR | Status: DC | PRN

## 2020-03-07 MED ORDER — POLYETHYLENE GLYCOL 3350 17 G PO PACK: 17.0000 g | PACK | Freq: Every day | ORAL | Status: DC | PRN

## 2020-03-07 MED ORDER — LIDOCAINE-EPINEPHRINE 1 %-1:100000 IJ SOLN
INTRAMUSCULAR | Status: DC | PRN
  Administered 2020-03-07: 12 mL

## 2020-03-07 MED ORDER — ONDANSETRON 4 MG PO TBDP: 4.0000 mg | ORAL_TABLET | Freq: Four times a day (QID) | ORAL | Status: DC | PRN

## 2020-03-07 MED ORDER — HYDROMORPHONE HCL 1 MG/ML IJ SOLN
INTRAMUSCULAR | Status: AC
  Filled 2020-03-07: qty 1

## 2020-03-07 MED ORDER — ACETAMINOPHEN 650 MG RE SUPP: 650.0000 mg | RECTAL | Status: DC | PRN

## 2020-03-07 MED ORDER — DEXTROSE 5 % IV SOLN: 0.5000 g | Freq: Three times a day (TID) | INTRAVENOUS | Status: DC

## 2020-03-07 MED ORDER — CEFAZOLIN SODIUM-DEXTROSE 2-4 GM/100ML-% IV SOLN
2.0000 g | INTRAVENOUS | Status: DC
  Filled 2020-03-07: qty 100

## 2020-03-07 MED ORDER — ZOLPIDEM TARTRATE 5 MG PO TABS: 5.0000 mg | ORAL_TABLET | Freq: Every evening | ORAL | Status: DC | PRN

## 2020-03-07 MED ORDER — PROPOFOL 10 MG/ML IV BOLUS
INTRAVENOUS | Status: DC | PRN
  Administered 2020-03-07: 150 mg via INTRAVENOUS

## 2020-03-07 MED ORDER — ALBUMIN HUMAN 5 % IV SOLN: INTRAVENOUS | Status: DC | PRN

## 2020-03-07 MED ORDER — TECHNETIUM TC 99M SULFUR COLLOID FILTERED
1.0000 | Freq: Once | INTRAVENOUS | Status: AC | PRN
  Administered 2020-03-07: 1 via INTRADERMAL

## 2020-03-07 MED ORDER — OXYCODONE HCL 5 MG PO TABS: 5.0000 mg | ORAL_TABLET | ORAL | Status: DC | PRN

## 2020-03-07 MED ORDER — LACTATED RINGERS IV SOLN: INTRAVENOUS | Status: DC

## 2020-03-07 MED ORDER — PHENYLEPHRINE 40 MCG/ML (10ML) SYRINGE FOR IV PUSH (FOR BLOOD PRESSURE SUPPORT)
PREFILLED_SYRINGE | INTRAVENOUS | Status: AC
  Filled 2020-03-07: qty 20

## 2020-03-07 MED ORDER — ONDANSETRON HCL 4 MG/2ML IJ SOLN
INTRAMUSCULAR | Status: DC | PRN
  Administered 2020-03-07: 4 mg via INTRAVENOUS

## 2020-03-07 MED ORDER — LIDOCAINE-EPINEPHRINE 1 %-1:100000 IJ SOLN
INTRAMUSCULAR | Status: AC
  Filled 2020-03-07: qty 1

## 2020-03-07 MED ORDER — SODIUM CHLORIDE 0.9 % IV SOLN: 250.0000 mL | INTRAVENOUS | Status: DC | PRN

## 2020-03-07 MED ORDER — KETOROLAC TROMETHAMINE 30 MG/ML IJ SOLN
INTRAMUSCULAR | Status: DC | PRN
  Administered 2020-03-07: 30 mg via INTRAVENOUS

## 2020-03-07 MED ORDER — FENTANYL CITRATE (PF) 100 MCG/2ML IJ SOLN: 25.0000 ug | INTRAMUSCULAR | Status: DC | PRN

## 2020-03-07 MED ORDER — FENTANYL CITRATE (PF) 100 MCG/2ML IJ SOLN
INTRAMUSCULAR | Status: AC
  Filled 2020-03-07: qty 2

## 2020-03-07 MED ORDER — DIPHENHYDRAMINE HCL 12.5 MG/5ML PO ELIX: 12.5000 mg | ORAL_SOLUTION | Freq: Four times a day (QID) | ORAL | Status: DC | PRN

## 2020-03-07 MED ORDER — PROMETHAZINE HCL 25 MG/ML IJ SOLN: 6.2500 mg | INTRAMUSCULAR | Status: DC | PRN

## 2020-03-07 MED ORDER — CHLORHEXIDINE GLUCONATE 0.12 % MT SOLN
15.0000 mL | Freq: Once | OROMUCOSAL | Status: AC
  Administered 2020-03-07: 15 mL via OROMUCOSAL
  Filled 2020-03-07: qty 15

## 2020-03-07 MED ORDER — LIDOCAINE 2% (20 MG/ML) 5 ML SYRINGE
INTRAMUSCULAR | Status: AC
  Filled 2020-03-07: qty 15

## 2020-03-07 MED ORDER — FENTANYL CITRATE (PF) 250 MCG/5ML IJ SOLN
INTRAMUSCULAR | Status: DC | PRN
  Administered 2020-03-07 (×4): 50 ug via INTRAVENOUS
  Administered 2020-03-07: 25 ug via INTRAVENOUS
  Administered 2020-03-07: 50 ug via INTRAVENOUS

## 2020-03-07 MED ORDER — SUGAMMADEX SODIUM 200 MG/2ML IV SOLN
INTRAVENOUS | Status: DC | PRN
  Administered 2020-03-07: 200 mg via INTRAVENOUS

## 2020-03-07 MED ORDER — SENNA 8.6 MG PO TABS: 1.0000 | ORAL_TABLET | Freq: Two times a day (BID) | ORAL | Status: DC

## 2020-03-07 MED ORDER — HYDROMORPHONE HCL 1 MG/ML IJ SOLN: 1.0000 mg | INTRAMUSCULAR | Status: DC | PRN

## 2020-03-07 MED ORDER — ROCURONIUM BROMIDE 10 MG/ML (PF) SYRINGE
PREFILLED_SYRINGE | INTRAVENOUS | Status: AC
  Filled 2020-03-07: qty 30

## 2020-03-07 MED ORDER — CEFAZOLIN SODIUM-DEXTROSE 2-4 GM/100ML-% IV SOLN: 2.0000 g | INTRAVENOUS | Status: DC

## 2020-03-07 MED ORDER — ROCURONIUM BROMIDE 10 MG/ML (PF) SYRINGE
PREFILLED_SYRINGE | INTRAVENOUS | Status: DC | PRN
  Administered 2020-03-07: 80 mg via INTRAVENOUS

## 2020-03-07 MED ORDER — ORAL CARE MOUTH RINSE: 15.0000 mL | Freq: Once | OROMUCOSAL | Status: AC

## 2020-03-07 MED ORDER — BUPIVACAINE HCL (PF) 0.25 % IJ SOLN
INTRAMUSCULAR | Status: DC | PRN
  Administered 2020-03-07: 16 mL

## 2020-03-07 MED ORDER — KCL IN DEXTROSE-NACL 10-5-0.45 MEQ/L-%-% IV SOLN: INTRAVENOUS | Status: DC

## 2020-03-07 MED ORDER — ONDANSETRON HCL 4 MG/2ML IJ SOLN: 4.0000 mg | Freq: Four times a day (QID) | INTRAMUSCULAR | Status: DC | PRN

## 2020-03-07 MED ORDER — SODIUM BICARBONATE 4 % IV SOLN
Freq: Once | INTRAVENOUS | Status: DC
  Filled 2020-03-07: qty 50

## 2020-03-07 MED ORDER — IBUPROFEN 600 MG PO TABS: 600.0000 mg | ORAL_TABLET | Freq: Four times a day (QID) | ORAL | Status: DC | PRN

## 2020-03-07 MED ORDER — MIDAZOLAM HCL 2 MG/2ML IJ SOLN
INTRAMUSCULAR | Status: DC | PRN
  Administered 2020-03-07: 2 mg via INTRAVENOUS

## 2020-03-07 MED ORDER — DEXAMETHASONE SODIUM PHOSPHATE 10 MG/ML IJ SOLN
INTRAMUSCULAR | Status: AC
  Filled 2020-03-07: qty 3

## 2020-03-07 MED ORDER — AMISULPRIDE (ANTIEMETIC) 5 MG/2ML IV SOLN: 10.0000 mg | Freq: Once | INTRAVENOUS | Status: DC | PRN

## 2020-03-07 MED ORDER — BUPIVACAINE HCL (PF) 0.25 % IJ SOLN
INTRAMUSCULAR | Status: AC
  Filled 2020-03-07: qty 30

## 2020-03-07 MED ORDER — PHENYLEPHRINE HCL-NACL 10-0.9 MG/250ML-% IV SOLN
INTRAVENOUS | Status: DC | PRN
  Administered 2020-03-07: 25 ug/min via INTRAVENOUS

## 2020-03-07 MED ORDER — CELECOXIB 200 MG PO CAPS
200.0000 mg | ORAL_CAPSULE | ORAL | Status: AC
  Administered 2020-03-07: 200 mg via ORAL
  Filled 2020-03-07: qty 1

## 2020-03-07 MED ORDER — LIDOCAINE 2% (20 MG/ML) 5 ML SYRINGE
INTRAMUSCULAR | Status: DC | PRN
  Administered 2020-03-07: 80 mg via INTRAVENOUS

## 2020-03-07 MED ORDER — ACETAMINOPHEN 325 MG PO TABS: 325.0000 mg | ORAL_TABLET | Freq: Four times a day (QID) | ORAL | Status: DC

## 2020-03-07 MED ORDER — EPHEDRINE 5 MG/ML INJ
INTRAVENOUS | Status: AC
  Filled 2020-03-07: qty 10

## 2020-03-07 MED ORDER — 0.9 % SODIUM CHLORIDE (POUR BTL) OPTIME
TOPICAL | Status: DC | PRN
  Administered 2020-03-07: 1000 mL

## 2020-03-07 MED ORDER — SODIUM CHLORIDE 0.9% FLUSH: 3.0000 mL | Freq: Two times a day (BID) | INTRAVENOUS | Status: DC

## 2020-03-07 SURGICAL SUPPLY — 73 items
APPLIER CLIP 9.375 MED OPEN (MISCELLANEOUS) ×4
BAG DECANTER FOR FLEXI CONT (MISCELLANEOUS) ×4 IMPLANT
BINDER BREAST LRG (GAUZE/BANDAGES/DRESSINGS) IMPLANT
BINDER BREAST MEDIUM (GAUZE/BANDAGES/DRESSINGS) IMPLANT
BINDER BREAST XLRG (GAUZE/BANDAGES/DRESSINGS) IMPLANT
BINDER BREAST XXLRG (GAUZE/BANDAGES/DRESSINGS) ×4 IMPLANT
BIOPATCH RED 1 DISK 7.0 (GAUZE/BANDAGES/DRESSINGS) ×8 IMPLANT
BLADE HEX COATED 2.75 (ELECTRODE) ×4 IMPLANT
BLADE SURG 15 STRL LF DISP TIS (BLADE) ×6 IMPLANT
BLADE SURG 15 STRL SS (BLADE) ×8
BNDG GAUZE ELAST 4 BULKY (GAUZE/BANDAGES/DRESSINGS) ×8 IMPLANT
CANISTER SUCT 3000ML PPV (MISCELLANEOUS) ×8 IMPLANT
CHLORAPREP W/TINT 26 (MISCELLANEOUS) ×8 IMPLANT
CLIP APPLIE 9.375 MED OPEN (MISCELLANEOUS) ×3 IMPLANT
CLOSURE STERI-STRIP 1/4X4 (GAUZE/BANDAGES/DRESSINGS) ×4 IMPLANT
CNTNR URN SCR LID CUP LEK RST (MISCELLANEOUS) ×3 IMPLANT
CONT SPEC 4OZ STRL OR WHT (MISCELLANEOUS) ×4
COVER PROBE W GEL 5X96 (DRAPES) ×4 IMPLANT
COVER SURGICAL LIGHT HANDLE (MISCELLANEOUS) ×4 IMPLANT
COVER WAND RF STERILE (DRAPES) ×4 IMPLANT
DECANTER SPIKE VIAL GLASS SM (MISCELLANEOUS) IMPLANT
DERMABOND ADVANCED (GAUZE/BANDAGES/DRESSINGS) ×1
DERMABOND ADVANCED .7 DNX12 (GAUZE/BANDAGES/DRESSINGS) ×3 IMPLANT
DEVICE DUBIN SPECIMEN MAMMOGRA (MISCELLANEOUS) ×4 IMPLANT
DRAIN CHANNEL 19F RND (DRAIN) ×8 IMPLANT
DRAPE CHEST BREAST 15X10 FENES (DRAPES) ×4 IMPLANT
DRAPE LAPAROSCOPIC ABDOMINAL (DRAPES) ×4 IMPLANT
DRSG OPSITE POSTOP 4X10 (GAUZE/BANDAGES/DRESSINGS) ×8 IMPLANT
DRSG PAD ABDOMINAL 8X10 ST (GAUZE/BANDAGES/DRESSINGS) ×12 IMPLANT
DRSG TEGADERM 2-3/8X2-3/4 SM (GAUZE/BANDAGES/DRESSINGS) ×8 IMPLANT
ELECT BLADE 4.0 EZ CLEAN MEGAD (MISCELLANEOUS) ×4
ELECT COATED BLADE 2.86 ST (ELECTRODE) ×4 IMPLANT
ELECT REM PT RETURN 9FT ADLT (ELECTROSURGICAL) ×8
ELECTRODE BLDE 4.0 EZ CLN MEGD (MISCELLANEOUS) ×3 IMPLANT
ELECTRODE REM PT RTRN 9FT ADLT (ELECTROSURGICAL) ×6 IMPLANT
EVACUATOR SILICONE 100CC (DRAIN) ×8 IMPLANT
GAUZE SPONGE 4X4 12PLY STRL (GAUZE/BANDAGES/DRESSINGS) ×8 IMPLANT
GLOVE BIO SURGEON STRL SZ 6.5 (GLOVE) ×8 IMPLANT
GLOVE BIO SURGEON STRL SZ7.5 (GLOVE) ×8 IMPLANT
GOWN STRL REUS W/ TWL LRG LVL3 (GOWN DISPOSABLE) ×12 IMPLANT
GOWN STRL REUS W/TWL LRG LVL3 (GOWN DISPOSABLE) ×16
KIT BASIN OR (CUSTOM PROCEDURE TRAY) ×4 IMPLANT
KIT MARKER MARGIN INK (KITS) ×4 IMPLANT
LIGHT WAVEGUIDE WIDE FLAT (MISCELLANEOUS) IMPLANT
NEEDLE 18GX1X1/2 (RX/OR ONLY) (NEEDLE) IMPLANT
NEEDLE FILTER BLUNT 18X 1/2SAF (NEEDLE)
NEEDLE FILTER BLUNT 18X1 1/2 (NEEDLE) IMPLANT
NEEDLE HYPO 25GX1X1/2 BEV (NEEDLE) ×4 IMPLANT
NEEDLE SPNL 22GX3.5 QUINCKE BK (NEEDLE) ×4 IMPLANT
NS IRRIG 1000ML POUR BTL (IV SOLUTION) ×8 IMPLANT
PACK GENERAL/GYN (CUSTOM PROCEDURE TRAY) ×8 IMPLANT
PAD ABD 8X10 STRL (GAUZE/BANDAGES/DRESSINGS) ×4 IMPLANT
SPONGE LAP 18X18 RF (DISPOSABLE) ×8 IMPLANT
STRIP CLOSURE SKIN 1/2X4 (GAUZE/BANDAGES/DRESSINGS) ×16 IMPLANT
SUT MNCRL AB 3-0 PS2 18 (SUTURE) ×12 IMPLANT
SUT MNCRL AB 3-0 PS2 27 (SUTURE) ×16 IMPLANT
SUT MNCRL AB 4-0 PS2 18 (SUTURE) ×32 IMPLANT
SUT MON AB 5-0 PS2 18 (SUTURE) ×24 IMPLANT
SUT PDS AB 2-0 CT2 27 (SUTURE) IMPLANT
SUT SILK 3 0 PS 1 (SUTURE) IMPLANT
SUT SILK 4 0 PS 2 (SUTURE) ×8 IMPLANT
SUT VIC AB 3-0 SH 18 (SUTURE) IMPLANT
SUT VIC AB 3-0 SH 27 (SUTURE)
SUT VIC AB 3-0 SH 27X BRD (SUTURE) IMPLANT
SUT VIC AB 4-0 PS2 18 (SUTURE) IMPLANT
SUT VICRYL 4-0 PS2 18IN ABS (SUTURE) IMPLANT
SYR 50ML LL SCALE MARK (SYRINGE) IMPLANT
SYR CONTROL 10ML LL (SYRINGE) ×4 IMPLANT
TOWEL GREEN STERILE (TOWEL DISPOSABLE) ×4 IMPLANT
TOWEL GREEN STERILE FF (TOWEL DISPOSABLE) ×12 IMPLANT
TUBE CONNECTING 20X1/4 (TUBING) ×4 IMPLANT
UNDERPAD 30X36 HEAVY ABSORB (UNDERPADS AND DIAPERS) ×8 IMPLANT
YANKAUER SUCT BULB TIP NO VENT (SUCTIONS) ×4 IMPLANT

## 2020-03-07 NOTE — Op Note (Addendum)
Breast Reduction Op note:    DATE OF PROCEDURE: 03/07/2020  LOCATION: [facility]   SURGEON: Lyndee Leo Sanger Ginger Leeth, DO  ASSISTANT: Roetta Sessions, PA  PREOPERATIVE DIAGNOSIS 1. Right breast cancer 2. Macromastia, Neck Pain / Back Pain  POSTOPERATIVE DIAGNOSIS Same  PROCEDURES 1. Bilateral oncoplastic breast reduction.  Right reduction 174 gm, Left reduction 645 gm for symmetry  COMPLICATIONS: None.  DRAINS: two  INDICATIONS FOR PROCEDURE Ariel Andersen is a 54 y.o. year-old female born on 01-22-1966,with a history of symptomatic macromastia with concominant back pain, neck pain, shoulder grooving from her bra.   MRN: [id number]  CONSENT Informed consent was obtained directly from the patient. The risks, benefits and alternatives were fully discussed. Specific risks including but not limited to bleeding, infection, hematoma, seroma, scarring, pain, nipple necrosis, asymmetry, poor cosmetic results, and need for further surgery were discussed. The patient had ample opportunity to have her questions answered to her satisfaction.  DESCRIPTION OF PROCEDURE  Patient was brought into the operating room and placed in a supine position.  SCDs were placed and appropriate padding was performed.  Antibiotics were given. The patient underwent general anesthesia and the chest was prepped and draped in a sterile fashion.  A timeout was performed and all information was confirmed to be correct.  Right side: General surgery started their portion of the case which included a partial mastectomy of the medial inferior quadrant of the breast.  They then performed a sentinel lymph node biopsy.  Once they were complete the patient was rendered to the plastic surgery service.  Due to the location of the partial mastectomy and inferior pedicle technique was the best option.  Preoperative markings were confirmed.  The inferior pedicle was de-epithelializing.  Instead of  excising the medial and the lateral limbs they were de-epithelialized in order to preserve more of the blood supply to the pedicle.  The Bovie was used to undermine the medial aspect without removing any further tissue.  This provided a flap to be able to bring the medial created flap down to the middle inframammary fold.  A small amount of tissue was excised from the lateral inferior breast in order to prevent a dogear.  The lateral portion was undermined with a thick flap in order to bring that down to meet the medial flap at the inframammary fold.  The superior flap was released for about 3 cm without releasing much of the inferior pedicle in order to preserve blood supply.  Hemostasis was achieved throughout with the electrocautery.  Once the superior portion was released the superior portion of the flap was rotated medially to fill the partial mastectomy defect.  Care was taken to not undermine the breast pedicle.  This provided a very nice shape for the breast.  The nipple was gently rotated into position and secured with 3-0 and 4-0 Monocryl.  Symmetry with the left breast was confirmed.  A drain was placed and secured to the skin with a 3-0 silk.  The deep tissues were approximated with 3-0 Monocryl sutures and the skin was closed with deep dermal and subcuticular 4-0 Monocryl sutures followed by 5-0 Monocryl.  The nipple and skin flaps had good capillary refill at the end of the procedure.     Left side: Preoperative markings were confirmed.  Incision lines were injected with 1% Xylocaine with epinephrine.  After waiting for vasoconstriction, the marked lines were incised.  A Wise-pattern superomedial breast reduction was performed by de-epithelializing the pedicle,  using bovie to create the superomedial pedicle, and removing breast tissue from the lateral and inferior portions of the breast.  Care was taken to not undermine the breast pedicle. Hemostasis was achieved.  The nipple was gently rotated into  position and the soft tissue was closed with 4-0 Monocryl.  The patient was sat upright and size and shape symmetry was confirmed.  The pocket was irrigated and hemostasis confirmed.  A drain was placed and secured to the chest wall with a 3-0 silk.  The deep tissues were approximated with 3-0 Monocryl sutures and the skin was closed with deep dermal and subcuticular 4-0 Monocryl sutures.  Dermabond was applied.  A breast binder and ABDs were placed.  The nipple and skin flaps had good capillary refill at the end of the procedure.  The patient tolerated the procedure well. The patient was allowed to wake from anesthesia and taken to the recovery room in satisfactory condition.  The advanced practice practitioner (APP) assisted throughout the case.  The APP was essential in retraction and counter traction when needed to make the case progress smoothly.  This retraction and assistance made it possible to see the tissue plans for the procedure.  The assistance was needed for blood control, tissue re-approximation and assisted with closure of the incision site.

## 2020-03-07 NOTE — Anesthesia Procedure Notes (Signed)
Procedure Name: Intubation Date/Time: 03/07/2020 1:45 PM Performed by: Clearnce Sorrel, CRNA Pre-anesthesia Checklist: Patient identified, Emergency Drugs available, Suction available, Patient being monitored and Timeout performed Patient Re-evaluated:Patient Re-evaluated prior to induction Oxygen Delivery Method: Circle system utilized Preoxygenation: Pre-oxygenation with 100% oxygen Induction Type: IV induction Ventilation: Mask ventilation without difficulty Laryngoscope Size: Mac and 3 Grade View: Grade I Tube type: Oral Tube size: 7.0 mm Number of attempts: 1 Airway Equipment and Method: Stylet Placement Confirmation: ETT inserted through vocal cords under direct vision,  positive ETCO2 and breath sounds checked- equal and bilateral Secured at: 22 cm Tube secured with: Tape Dental Injury: Teeth and Oropharynx as per pre-operative assessment

## 2020-03-07 NOTE — Interval H&P Note (Signed)
History and Physical Interval Note:  03/07/2020 11:44 AM  Ariel Andersen  has presented today for surgery, with the diagnosis of RIGHT BREAST DUCTAL CARCINOMA IN SITU.  The various methods of treatment have been discussed with the patient and family. After consideration of risks, benefits and other options for treatment, the patient has consented to  Procedure(s): RIGHT BREAST REDUCTION LUMPECTOMY WITH RADIOACTIVE SEED AND SENTINEL LYMPH NODE BIOPSY (Right) MAMMARY REDUCTION  (BREAST) ONCOPLASTIC REDUCTION (Bilateral) as a surgical intervention.  The patient's history has been reviewed, patient examined, no change in status, stable for surgery.  I have reviewed the patient's chart and labs.  Questions were answered to the patient's satisfaction.     [name]

## 2020-03-07 NOTE — Op Note (Addendum)
03/07/2020  3:22 PM  PATIENT:  Ariel Andersen  54 y.o. female  PRE-OPERATIVE DIAGNOSIS:  RIGHT BREAST DUCTAL CARCINOMA IN SITU  POST-OPERATIVE DIAGNOSIS:  RIGHT BREAST DUCTAL CARCINOMA IN SITU  PROCEDURE:  Procedure(s): RIGHT BREAST REDUCTION LUMPECTOMY WITH RADIOACTIVE SEED LOCALIZATION X 2 AND DEEP RIGHT AXILLARY SENTINEL LYMPH NODE BIOPSY (Right)  SURGEON:  Surgeon(s) and Role: Panel 1:    * Jovita Kussmaul, MD - Primary  PHYSICIAN ASSISTANT:   ASSISTANTS: Dr. Rob Hickman   ANESTHESIA:   local and general  EBL:  minimal   BLOOD ADMINISTERED:none  DRAINS: none   LOCAL MEDICATIONS USED:  MARCAINE     SPECIMEN:  Source of Specimen:  right breast tissue with additional inferior margin and sentinel node  DISPOSITION OF SPECIMEN:  PATHOLOGY  COUNTS:  YES  TOURNIQUET:  * No tourniquets in log *  DICTATION: .Dragon Dictation   After informed consent was obtained the patient was brought to the operating room and placed in the supine position on the operating table.  After adequate induction of general anesthesia the patient's bilateral chest, breast, and axillary areas were prepped with ChloraPrep, allowed to dry, and draped in usual sterile manner.  An appropriate timeout was performed.  Preoperatively 1 mCi of technetium sulfur colloid was injected in the subareolar position on the right breast.  Previously to I-125 seeds were placed in the inner aspect of the right breast to bracket an area of ductal carcinoma in situ.  The patient was having a reduction surgery at the same time.  The plastic surgeon marked the areas needed for the incision.  I was then able to follow those incision lines medially with a 15 blade knife.  The neoprobe was set to I-125 the dissection was then carried out between the breast tissue and subcutaneous fat along the inner aspect of the right breast until we were beyond the area of the radioactivity.  A large circular portion of breast tissue was then  excised sharply with the electrocautery around the 2 radioactive seeds while checking the area of radioactivity frequently.  This dissection was carried to the chest wall. Once the specimen was removed it was oriented with the appropriate paint colors.  A specimen radiograph was obtained that showed the clip and both seeds to be within the specimen.  I did elect to remove an additional inferior edge from the lumpectomy cavity.  This was marked appropriately.  All this tissue was then sent to pathology for further evaluation.  Attention was then turned to the right axilla.  The neoprobe was set to technetium and there was a good signal in the right axilla.  This area was infiltrated with quarter percent Marcaine.  A small transversely oriented incision was made with a 15 blade knife overlying the area of radioactivity.  The incision was carried through the skin and subcutaneous tissue sharply with the electrocautery until the deep right axillary space was entered.  Blunt hemostat dissection was then carried out under the direction of the neoprobe.  I was able to identify a large hot lymph node.  This lymph node was excised sharply with the electrocautery and the surrounding small vessels and lymphatics were controlled with clips.  Ex vivo counts on this node were approximately 200.  No other hot or palpable nodes were identified in the right axilla.  Hemostasis was achieved using the Bovie electrocautery.  The area of the lateral pec muscle near the pectoral nerves was infiltrated with quarter percent Marcaine.  The deep layer of the wound was then closed with interrupted 3-0 Vicryl stitches.  The skin was closed with a running 4-0 Monocryl subcuticular stitch.  At this point the operation was turned over to Dr. Marla Roe for the reduction surgery.  Her portion will be dictated separately.  All needle sponge and instrument counts were correct.  The patient was in stable condition at this point.  PLAN OF CARE:  Discharge to home after PACU  PATIENT DISPOSITION:  PACU - hemodynamically stable.   Delay start of Pharmacological VTE agent (>24hrs) due to surgical blood loss or risk of bleeding: not applicable

## 2020-03-07 NOTE — Anesthesia Preprocedure Evaluation (Addendum)
Anesthesia Evaluation  Patient identified by MRN, date of birth, ID band Patient awake    Reviewed: Allergy & Precautions, NPO status , Patient's Chart, lab work & pertinent test results  Airway Mallampati: II  TM Distance: >3 FB Neck ROM: Full    Dental no notable dental hx.    Pulmonary neg pulmonary ROS,    Pulmonary exam normal breath sounds clear to auscultation       Cardiovascular Exercise Tolerance: Good hypertension, Normal cardiovascular exam Rhythm:Regular Rate:Normal     Neuro/Psych negative neurological ROS  negative psych ROS   GI/Hepatic negative GI ROS, Neg liver ROS,   Endo/Other  negative endocrine ROS  Renal/GU negative Renal ROS     Musculoskeletal  (+) Arthritis ,   Abdominal   Peds  Hematology negative hematology ROS (+)   Anesthesia Other Findings Breast cancer  Reproductive/Obstetrics                            Anesthesia Physical Anesthesia Plan  ASA: III  Anesthesia Plan: General   Post-op Pain Management:    Induction: Intravenous  PONV Risk Score and Plan: 3 and Scopolamine patch - Pre-op, Dexamethasone and Ondansetron  Airway Management Planned: Oral ETT  Additional Equipment: None  Intra-op Plan:   Post-operative Plan: Extubation in OR  Informed Consent: I have reviewed the patients History and Physical, chart, labs and discussed the procedure including the risks, benefits and alternatives for the proposed anesthesia with the patient or authorized representative who has indicated his/her understanding and acceptance.     Dental advisory given  Plan Discussed with: Anesthesiologist and CRNA  Anesthesia Plan Comments:        Anesthesia Quick Evaluation

## 2020-03-07 NOTE — Discharge Instructions (Signed)
INSTRUCTIONS FOR AFTER SURGERY   You will likely have some questions about what to expect following your operation.  The following information will help you and your family understand what to expect when you are discharged from the hospital.  Following these guidelines will help ensure a smooth recovery and reduce risks of complications.  Postoperative instructions include information on: diet, wound care, medications and physical activity.  AFTER SURGERY Expect to go home after the procedure.  In some cases, you may need to spend one night in the hospital for observation.  DIET This surgery does not require a specific diet.  However, I have to mention that the healthier you eat the better your body can start healing. It is important to increasing your protein intake.  This means limiting the foods with added sugar.  Focus on fruits and vegetables and some meat. It is very important to drink water after your surgery.  If your urine is bright yellow, then it is concentrated, and you need to drink more water.  As a general rule after surgery, you should have 8 ounces of water every hour while awake.  If you find you are persistently nauseated or unable to take in liquids let us know.  NO TOBACCO USE or EXPOSURE.  This will slow your healing process and increase the risk of a wound.  WOUND CARE If you have a drain: Clean with baby wipes for 3 days and then you can shower.   If you have steri-strips / tape directly attached to your skin leave them in place. It is OK to get these wet.  No baths, pools or hot tubs for two weeks. We close your incision to leave the smallest and best-looking scar. No ointment or creams on your incisions until given the go ahead.  Especially not Neosporin (Too many skin reactions with this one).  A few weeks after surgery you can use Mederma and start massaging the scar. We ask you to wear your binder or sports bra for the first 6 weeks around the clock, including while  sleeping. This provides added comfort and helps reduce the fluid accumulation at the surgery site.  ACTIVITY No heavy lifting until cleared by the doctor.  It is OK to walk and climb stairs. In fact, moving your legs is very important to decrease your risk of a blood clot.  It will also help keep you from getting deconditioned.  Every 1 to 2 hours get up and walk for 5 minutes. This will help with a quicker recovery back to normal.  Let pain be your guide so you don't do too much.  NO, you cannot do the spring cleaning and don't plan on taking care of anyone else.  This is your time for TLC.   WORK Everyone returns to work at different times. As a rough guide, most people take at least 1 - 2 weeks off prior to returning to work. If you need documentation for your job, bring the forms to your postoperative follow up visit.  DRIVING Arrange for someone to bring you home from the hospital.  You may be able to drive a few days after surgery but not while taking any narcotics or valium.  BOWEL MOVEMENTS Constipation can occur after anesthesia and while taking pain medication.  It is important to stay ahead for your comfort.  We recommend taking Milk of Magnesia (2 tablespoons; twice a day) while taking the pain pills.  SEROMA This is fluid your body tried to  put in the surgical site.  This is normal but if it creates excessive pain and swelling let us know.  It usually decreases in a few weeks.  MEDICATIONS and PAIN CONTROL At your preoperative visit for you history and physical you were given the following medications: 1. An antibiotic: Start this medication when you get home and take according to the instructions on the bottle. 2. Zofran 4 mg:  This is to treat nausea and vomiting.  You can take this every 6 hours as needed and only if needed. 3. Norco (hydrocodone/acetaminophen) 5/325 mg:  This is only to be used after you have taken the motrin or the tylenol. Every 8 hours as needed. Over the  counter Medication to take: 4. Ibuprofen (Motrin) 600 mg:  Take this every 6 hours.  If you have additional pain then take 500 mg of the tylenol.  Only take the Norco after you have tried these two. 5. Miralax or stool softener of choice: Take this according to the bottle if you take the [place] Call your surgeon's office if any of the following occur: . Fever 101 degrees F or greater . Excessive bleeding or fluid from the incision site. . Pain that increases over time without aid from the medications . Redness, warmth, or pus draining from incision sites . Persistent nausea or inability to take in liquids . Severe misshapen area that underwent the operation.  [facility] Plastic Surgery Specialist  What is the benefit of having a drain?  During surgery your tissue layers are separated.  This raw surface stimulates your body to fill the space with serous fluid.  This is normal but you don't want that fluid to collect and prevent healing.  A fluid collection can also become infected.  The Jackson-Pratt (JP) drain is used to eliminate this collection of fluid and allow the tissue to heal together.    Jackson-Pratt (JP) bulb    How to care for your drainage and suction unit at home Your drainage catheter will be connected to a collection device. The vacuum caused when the device is compressed allows drainage to collect in the device.    Wendee Copp your hands with soap and water before and after touching the system. . Empty the JP drain every 12 hours once you get home from your procedure. . Record the fluid amount on the record sheet included. . Start with stripping the drain tube to push the clots or excess fluid to the bulb.  Do this by pinching the tube with one hand near your skin.  Then with the other hand squeeze the tubing and work it toward the bulb.  This should be done several times a day.  This may collapse the tube which will correct on its own.   . Use a safety pin to attach  your collection device to your clothing so there is no tension on the insertion site.   . If you have drainage at the skin insertion site, you can apply a gauze dressing and secure it with tape. . If the drain falls out, apply a gauze dressing over the drain insertion site and secure with tape.   To empty the collection device:   . Release the stopper on the top of the collection unit (bulb).  Signa Kell contents into a measuring container such as a plastic medicine cup.  . Record the day and amount of drainage on the attached sheet. . This should be done at least  twice a day.    To compress the Jackson-Pratt Bulb:  . Release the stopper at the top of the bulb. Marland Kitchen Squeeze the bulb tightly in your fist, squeezing air out of the bulb.  . Replace the stopper while the bulb is compressed.  . Be careful not to spill the contents when squeezing the bulb. . The drainage will start bright red and turn to pink and then yellow with time. . IMPORTANT: If the bulb is not squeezed before adding the stopper it will not draw out the fluid.  Care for the JP drain site and your skin daily:  . You may shower three days after surgery. . Secure the drain to a ribbon or cloth around your waist while showering so it does not pull out while showering. . Be sure your hands are cleaned with soap and water. . Use a clean wet cotton swab to clean the skin around the drain site.  . Use another cotton swab to place Vaseline or antibiotic ointment on the skin around the drain.     Contact your physician if any of the following occur:  Marland Kitchen The fluid in the bulb becomes cloudy. . Your temperature is greater than 101.4.  Marland Kitchen The incision opens. . If you have drainage at the skin insertion site, you can apply a gauze dressing and secure it with tape. . If the drain falls out, apply a gauze dressing over the drain insertion site and secure with tape.  . You will usually have more drainage when you are active than while you rest  or are asleep. If the drainage increases significantly or is bloody call the physician                             Bring this record with you to each office visit Date  Drainage Volume  Date   Drainage volume

## 2020-03-07 NOTE — Anesthesia Postprocedure Evaluation (Signed)
Anesthesia Post Note  Patient: Ariel Andersen  Procedure(s) Performed: RIGHT BREAST REDUCTION LUMPECTOMY WITH RADIOACTIVE SEED (Right Breast) RIGHT SENTINEL LYMPH NODE BIOPSY (Right Axilla) MAMMARY REDUCTION  (BREAST) ONCOPLASTIC REDUCTION (Bilateral Breast)     Patient location during evaluation: PACU Anesthesia Type: General Level of consciousness: sedated Pain management: pain level controlled Vital Signs Assessment: post-procedure vital signs reviewed and stable Respiratory status: spontaneous breathing and respiratory function stable Cardiovascular status: stable Postop Assessment: no apparent nausea or vomiting Anesthetic complications: no   No complications documented.  Last Vitals:  Vitals:   03/07/20 1815 03/07/20 1900  BP: 127/82 120/72  Pulse: 75 70  Resp: 20 18  Temp:  36.8 C  SpO2: 97% 98%    Last Pain:  Vitals:   03/07/20 1900  TempSrc:   PainSc: [place]

## 2020-03-07 NOTE — H&P (Signed)
[place]  Location: [facility] Patient #: [id number] DOB: 1965/09/04 Married / Language: English / Race: Black or African American Female   History of Present Illness  The patient is a 54 year old female who presents with breast cancer.We are asked to see the patient in consultation by Dr. Burr Medico to evaluate her for a new right breast cancer. The patient is a 54 year old black female who presents with 4cm area of calcs in the LIQ of the right breast. 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 She is otherwise in good health and does not smoke   Past Surgical History  Breast Biopsy  Right.  Diagnostic Studies History  Colonoscopy  1-5 years ago Mammogram  within last year Pap Smear  1-5 years ago  Medication History Medications Reconciled  Social History Alcohol use  Remotely quit alcohol use. Caffeine use  Coffee. No drug use  Tobacco use  Never smoker.  Family History Heart Disease  Father. Hypertension  Brother, Father, Mother.  Pregnancy / Birth History Age at menarche  22 years. Contraceptive History  Oral contraceptives. Gravida  1 Length (months) of breastfeeding  3-6 Maternal age  79-30 Para  1 Regular periods   Other Problems  Arthritis  Hemorrhoids  High blood pressure     Review of Systems  General Not Present- Appetite Loss, Chills, Fatigue, Fever, Night Sweats, Weight Gain and Weight Loss. Skin Not Present- Change in Wart/Mole, Dryness, Hives, Jaundice, New Lesions, Non-Healing Wounds, Rash and Ulcer. HEENT Not Present- Earache, Hearing Loss, Hoarseness, Nose Bleed, Oral Ulcers, Ringing in the Ears, Seasonal Allergies, Sinus Pain, Sore Throat, Visual Disturbances, Wears glasses/contact lenses and Yellow Eyes. Respiratory Present- Snoring. Not Present- Bloody sputum, Chronic Cough, Difficulty Breathing and Wheezing. Breast Present- Breast Pain. Not Present- Breast Mass, Nipple Discharge and  Skin Changes. Cardiovascular Not Present- Chest Pain, Difficulty Breathing Lying Down, Leg Cramps, Palpitations, Rapid Heart Rate, Shortness of Breath and Swelling of Extremities. Gastrointestinal Not Present- Abdominal Pain, Bloating, Bloody Stool, Change in Bowel Habits, Chronic diarrhea, Constipation, Difficulty Swallowing, Excessive gas, Gets full quickly at meals, Hemorrhoids, Indigestion, Nausea, Rectal Pain and Vomiting. Female Genitourinary Present- Frequency. Not Present- Nocturia, Painful Urination, Pelvic Pain and Urgency. Musculoskeletal Present- Back Pain. Not Present- Joint Pain, Joint Stiffness, Muscle Pain, Muscle Weakness and Swelling of Extremities. Neurological Not Present- Decreased Memory, Fainting, Headaches, Numbness, Seizures, Tingling, Tremor, Trouble walking and Weakness. Psychiatric Not Present- Anxiety, Bipolar, Change in Sleep Pattern, Depression, Fearful and Frequent crying. Endocrine Not Present- Cold Intolerance, Excessive Hunger, Hair Changes, Heat Intolerance, Hot flashes and New Diabetes. Hematology Not Present- Blood Thinners, Easy Bruising, Excessive bleeding, Gland problems, HIV and Persistent Infections.   Physical Exam  General Mental Status-Alert. General Appearance-Consistent with stated age. Hydration-Well hydrated. Voice-Normal.  Head and Neck Head-normocephalic, atraumatic with no lesions or palpable masses. Trachea-midline. Thyroid Gland Characteristics - normal size and consistency.  Eye Eyeball - Bilateral-Extraocular movements intact. Sclera/Conjunctiva - Bilateral-No scleral icterus.  Chest and Lung Exam Chest and lung exam reveals -quiet, even and easy respiratory effort with no use of accessory muscles and on auscultation, normal breath sounds, no adventitious sounds and normal vocal resonance. Inspection Chest Wall - Normal. Back - normal.  Breast Note: there is a 4cm palpable mass in the inner right breast.  There is no palpable mass in the left breast. there is no palpable axillary, supraclavicular, or cervical lymphadenopathy   Cardiovascular Cardiovascular examination reveals -normal heart  sounds, regular rate and rhythm with no murmurs and normal pedal pulses bilaterally.  Abdomen Inspection Inspection of the abdomen reveals - No Hernias. Skin - Scar - no surgical scars. Palpation/Percussion Palpation and Percussion of the abdomen reveal - Soft, Non Tender, No Rebound tenderness, No Rigidity (guarding) and No hepatosplenomegaly. Auscultation Auscultation of the abdomen reveals - Bowel sounds normal.  Neurologic Neurologic evaluation reveals -alert and oriented x 3 with no impairment of recent or remote memory. Mental Status-Normal.  Musculoskeletal Normal Exam - Left-Upper Extremity Strength Normal and Lower Extremity Strength Normal. Normal Exam - Right-Upper Extremity Strength Normal and Lower Extremity Strength Normal.  Lymphatic Head & Neck  General Head & Neck Lymphatics: Bilateral - Description - Normal. Axillary  General Axillary Region: Bilateral - Description - Normal. Tenderness - Non Tender. Femoral & Inguinal  Generalized Femoral & Inguinal Lymphatics: Bilateral - Description - Normal. Tenderness - Non Tender.    Assessment & Plan  DUCTAL CARCINOMA IN SITU (DCIS) OF RIGHT BREAST (D05.11) Impression: there appears to be large area of dcis in the inner right breast. I have discuused with her the different options for treatment and at this point I think her best option is for mastectomy and sentinel node biopsy. she wants to keep her breast so another option would be for reduction lumpectomy. the last option would be lumpectomy but this would likely change the appearance of the breast in an unacceptable way. we will refer her to Dr. Marla Roe and then plan surgery from there. I have discussed with her the risks and benenfits of the surgery as well as some of  the technical aspects and she understands and wishes to proceed. This patient encounter took 60 minutes today to perform the following: take history, perform exam, review outside records, interpret imaging, counsel the patient on their diagnosis and document encounter, findings & plan in the EHR Current Plans Referred to Oncology, for evaluation and follow up (Oncology). Routine. Referred to Surgery - Plastic, for evaluation and follow up (Plastic Surgery). Routine.

## 2020-03-07 NOTE — Transfer of Care (Signed)
Immediate Anesthesia Transfer of Care Note  Patient: Ariel Andersen  Procedure(s) Performed: RIGHT BREAST REDUCTION LUMPECTOMY WITH RADIOACTIVE SEED AND SENTINEL LYMPH NODE BIOPSY (Right Breast) RIGHT SENTINEL LYMPH NODE BIOPSY (Right Axilla) MAMMARY REDUCTION  (BREAST) ONCOPLASTIC REDUCTION (Bilateral Breast)  Patient Location: PACU  Anesthesia Type:General  Level of Consciousness: drowsy  Airway & Oxygen Therapy: Patient Spontanous Breathing and Patient connected to face mask oxygen  Post-op Assessment: Report given to RN and Post -op Vital signs reviewed and stable  Post vital signs: Reviewed and stable  Last Vitals:  Vitals Value Taken Time  BP 132/80 03/07/20 1705  Temp 36.7 C 03/07/20 1706  Pulse 73 03/07/20 1711  Resp 20 03/07/20 1711  SpO2 99 % 03/07/20 1711  Vitals shown include unvalidated device data.  Last Pain:  Vitals:   03/07/20 1706  TempSrc:   PainSc: Asleep      Patients Stated Pain Goal: 2 (87/56/43 3295)  Complications: No complications documented.

## 2020-03-08 ENCOUNTER — Encounter (HOSPITAL_COMMUNITY): Payer: Self-pay | Admitting: General Surgery

## 2020-03-12 ENCOUNTER — Encounter: Payer: Self-pay | Admitting: *Deleted

## 2020-03-12 NOTE — Discharge Summary (Signed)
Physician Discharge Summary  Patient ID: MIAA LATTERELL MRN: [id number] DOB/AGE: [birth year] 54 y.o.  Admit date: 03/07/2020 Discharge date: 03/08/20  Admission Diagnoses: Breast cancer  Discharge Diagnoses:  Active Problems:   Breast cancer [facility])   Discharged Condition: good  Hospital Course: The patient was taken to the OR and underwent a partial mastectomy of her right breast for treatment of breast cancer.  This was done by the general surgery team.  The patient then underwent bilateral breast reductions by me. She did very well.  She was kept for pain control.  At the time of discharge she was tolerating food well, pain was controlled and she was ambulating.  Consults: None  Significant Diagnostic Studies: none  Treatments: surgery  Discharge Exam: Blood pressure 120/72, pulse 70, temperature 98.3 F (36.8 C), resp. rate 18, last menstrual period 02/18/2020, SpO2 98 %. General appearance: alert, cooperative and no distress Pain controlled.  Disposition: Discharge disposition: 01-Home or Self Care        Allergies as of 03/07/2020   No Known Allergies     Medication List    TAKE these medications   acetaminophen 500 MG tablet Commonly known as: TYLENOL Take 1 tablet (500 mg total) by mouth every 6 (six) hours as needed. For use AFTER surgery   amLODipine 5 MG tablet Commonly known as: NORVASC Take 5 mg by mouth daily.   aspirin 81 MG EC tablet Take 81 mg by mouth daily.   atorvastatin 10 MG tablet Commonly known as: LIPITOR Take 10 mg by mouth daily.   Biotin 10000 MCG Tabs Take 10,000 mcg by mouth daily.   cholecalciferol 1000 units tablet Commonly known as: VITAMIN D Take 1,000 Units by mouth daily.   ferrous sulfate 325 (65 FE) MG EC tablet Take 325 mg by mouth daily.   fluocinolone 0.01 % external solution Commonly known as: SYNALAR Apply 1 application topically daily as needed (scalp irritation).   ibuprofen 600 MG tablet Commonly  known as: ADVIL Take 1 tablet (600 mg total) by mouth every 6 (six) hours as needed for mild pain or moderate pain. For use AFTER surgery   MULTIVITAMIN WOMEN PO Take 1 tablet by mouth daily.   ondansetron 4 MG tablet Commonly known as: Zofran Take 1 tablet (4 mg total) by mouth every 8 (eight) hours as needed for nausea or vomiting.   triamcinolone cream 0.1 % Commonly known as: KENALOG Apply 1 application topically daily as needed (eczema).       Follow-up Information    [name], MD In 2 weeks.   Specialty: General Surgery Contact information: [address] [place] [address] [phone]        Wallace Going, DO In 10 days.   Specialty: Plastic Surgery Contact information: [place] [address] [phone]               Signed: Wallace Going 03/12/2020, 3:36 PM

## 2020-03-14 ENCOUNTER — Encounter: Payer: Self-pay | Admitting: *Deleted

## 2020-03-16 ENCOUNTER — Ambulatory Visit (INDEPENDENT_AMBULATORY_CARE_PROVIDER_SITE_OTHER): Payer: BC Managed Care – PPO | Admitting: Plastic Surgery

## 2020-03-16 ENCOUNTER — Encounter: Payer: Self-pay | Admitting: Plastic Surgery

## 2020-03-16 ENCOUNTER — Other Ambulatory Visit: Payer: Self-pay

## 2020-03-16 VITALS — BP 142/82 | HR 79 | Temp 98.0°F

## 2020-03-16 DIAGNOSIS — D0511 Intraductal carcinoma in situ of right breast: Secondary | ICD-10-CM

## 2020-03-16 DIAGNOSIS — Z719 Counseling, unspecified: Secondary | ICD-10-CM

## 2020-03-16 NOTE — Progress Notes (Signed)
The patient is a 54 year old female here for follow-up on her breast reduction.  The drains are in and have minimal output.  I would like to give it 1 more week.  There is no sign of seroma or hematoma.  The incisions are healing well and the dressings are in place.  I offered to remove them because the patient was complaining about it feeling right.  In the end she said to leave them in.  She is going to get some lidocaine to put around the drain to help dull the discomfort.  She can shower.  She can also go into a sports bra or continue with the breast binder.

## 2020-03-23 ENCOUNTER — Encounter: Payer: Self-pay | Admitting: Surgical

## 2020-03-23 ENCOUNTER — Telehealth: Payer: Self-pay

## 2020-03-23 ENCOUNTER — Other Ambulatory Visit: Payer: Self-pay

## 2020-03-23 ENCOUNTER — Ambulatory Visit (INDEPENDENT_AMBULATORY_CARE_PROVIDER_SITE_OTHER): Payer: BC Managed Care – PPO | Admitting: Surgical

## 2020-03-23 VITALS — BP 137/86 | HR 80 | Temp 98.6°F

## 2020-03-23 DIAGNOSIS — D0511 Intraductal carcinoma in situ of right breast: Secondary | ICD-10-CM

## 2020-03-23 NOTE — Telephone Encounter (Signed)
Faxed referral to [facility] [employer] for mastectomy supplies. 

## 2020-03-23 NOTE — Progress Notes (Signed)
Patient is a 54 year old female here for follow-up after her breast reduction.  Patient underwent right breast lumpectomy with right sentinel lymph node biopsy by general surgery followed by oncoplastic reduction by Dr. Marla Roe on 03/07/2020.  Patient has bilateral JP drains in place. Patient reports she is doing well overall, JP drains have been quite tender, specifically the right side.  She reports that drain output has been very minimal.  Chaperone present on exam On exam bilateral nipple areolar complexes are viable, bilateral breast incisions intact.  Bilateral JP drains in place.  No wounds noted.  Bilateral breasts are soft, no fluid wave noted with palpation.  No tenderness palpation.  No erythema or cellulitic changes noted.  Bilateral JP drains removed, patient tolerated this fine.  She had immediate relief. Recommend continue her compressive garment to help prevent swelling. Recommend continue to avoid strenuous activity. Patient is going to return to work on 03/28/2020, she works from home until 04/03/2020. She is scheduled to start radiation soon. Recommend calling with questions or concerns, she has a follow-up on 04/06/2020 with Dr. Marla Roe.

## 2020-03-28 ENCOUNTER — Other Ambulatory Visit: Payer: Self-pay

## 2020-03-28 ENCOUNTER — Encounter: Payer: Self-pay | Admitting: Radiation Oncology

## 2020-03-28 LAB — SURGICAL PATHOLOGY

## 2020-04-02 ENCOUNTER — Other Ambulatory Visit: Payer: Self-pay

## 2020-04-02 ENCOUNTER — Encounter: Payer: Self-pay | Admitting: Physical Therapy

## 2020-04-02 ENCOUNTER — Ambulatory Visit: Payer: BC Managed Care – PPO | Attending: General Surgery | Admitting: Physical Therapy

## 2020-04-02 DIAGNOSIS — R600 Localized edema: Secondary | ICD-10-CM | POA: Insufficient documentation

## 2020-04-02 DIAGNOSIS — Z170 Estrogen receptor positive status [ER+]: Secondary | ICD-10-CM | POA: Insufficient documentation

## 2020-04-02 DIAGNOSIS — C50311 Malignant neoplasm of lower-inner quadrant of right female breast: Secondary | ICD-10-CM | POA: Diagnosis not present

## 2020-04-02 DIAGNOSIS — R293 Abnormal posture: Secondary | ICD-10-CM | POA: Diagnosis present

## 2020-04-02 DIAGNOSIS — M25611 Stiffness of right shoulder, not elsewhere classified: Secondary | ICD-10-CM | POA: Insufficient documentation

## 2020-04-02 NOTE — Therapy (Signed)
[place], [place], [address] Phone: [phone]   Fax:  [phone]  Physical Therapy Treatment  Patient Details  Name: Ariel Andersen MRN: [id number] Date of Birth: 1965-09-13 Referring Provider (PT): Dr. Autumn Messing   Encounter Date: 04/02/2020   PT End of Session - 04/02/20 1553    Visit Number 2    Number of Visits 10    Date for PT Re-Evaluation 04/30/20    PT Start Time 1500    PT Stop Time 1552    PT Time Calculation (min) 52 min    Activity Tolerance Patient tolerated treatment well    Behavior During Therapy [facility] for tasks assessed/performed           Past Medical History:  Diagnosis Date  . Arthritis of knee, left   . Breast cancer ([place])   . Family history of pancreatic cancer 01/12/2020  . Hypertension   . Pre-diabetes    per patient  . Seasonal allergies     Past Surgical History:  Procedure Laterality Date  . BACK SURGERY  2017  . BREAST LUMPECTOMY WITH RADIOACTIVE SEED AND SENTINEL LYMPH NODE BIOPSY Right 03/07/2020   Procedure: RIGHT BREAST REDUCTION LUMPECTOMY WITH RADIOACTIVE SEED;  Surgeon: Jovita Kussmaul, MD;  Location: [place];  Service: General;  Laterality: Right;  . BREAST REDUCTION SURGERY Bilateral 03/07/2020   Procedure: MAMMARY REDUCTION  (BREAST) ONCOPLASTIC REDUCTION;  Surgeon: Wallace Going, DO;  Location: [place];  Service: Plastics;  Laterality: Bilateral;  . SENTINEL NODE BIOPSY Right 03/07/2020   Procedure: RIGHT SENTINEL LYMPH NODE BIOPSY;  Surgeon: Jovita Kussmaul, MD;  Location: [place];  Service: General;  Laterality: Right;    There were no vitals filed for this visit.   Subjective Assessment - 04/02/20 1501    Subjective Patient reports she underwent a right lumpectomy and sentinel node biopsy on 03/07/2020 with 2 negative nodes removed. Findings were DCIS so she will not need chemotherapy but will udergo radiation therapy. She also had a  bilateral breast reduction performed at the same time.    Pertinent History Patient was diagnosed on 12/05/2019 with right intermediate grade DCIS. She underwent a right lumpectomy and sentinel node biopsy on 03/07/2020 with 2 negative nodes removed and a bilateral breast reduction. It is ER/PR positive.    Patient Stated Goals Get my arm moving again.    Currently in Pain? Yes              [facility] PT Assessment - 04/02/20 0001      Assessment   Medical Diagnosis s/p right lumpectomy and SLNB with bil reduction    Referring Provider (PT) Dr. Autumn Messing    Onset Date/Surgical Date 03/07/20    Hand Dominance Right    Prior Therapy Baselines      Precautions   Precautions Other (comment)    Precaution Comments right arm lymphedema risk      Restrictions   Weight Bearing Restrictions No      Balance Screen   Has the patient fallen in the past 6 months No    Has the patient had a decrease in activity level because of a fear of falling?  No    Is the patient reluctant to leave their home because of a fear of falling?  No      Home Environment   Living Environment Private residence    Living Arrangements Spouse/significant other;Children   Husband and 49 y.o.  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 Bilateral breasts have significant edema due to the breast reduction. All other incisions appear to be healing well with no sign of infection or healing issues.      Posture/Postural Control   Posture/Postural Control Postural limitations     Postural Limitations Rounded Shoulders;Forward head      ROM / Strength   AROM / PROM / Strength AROM      AROM   AROM Assessment Site Shoulder    Right/Left Shoulder Right    Right Shoulder Extension 55 Degrees    Right Shoulder Flexion 128 Degrees    Right Shoulder ABduction 130 Degrees    Right Shoulder Internal Rotation 47 Degrees    Right Shoulder External Rotation 82 Degrees      Strength   Overall Strength Unable to assess;Due to precautions             LYMPHEDEMA/ONCOLOGY QUESTIONNAIRE - 04/02/20 0001      Type   Cancer Type Right breast cancer      Surgeries   Lumpectomy Date 03/07/20    Sentinel Lymph Node Biopsy Date 03/07/20    Number Lymph Nodes Removed 2      Treatment   Active Chemotherapy Treatment No    Past Chemotherapy Treatment No    Active Radiation Treatment No    Past Radiation Treatment No    Current Hormone Treatment No    Past Hormone Therapy No      What other symptoms do you have   Are you Having Heaviness or Tightness Yes    Are you having Pain Yes    Are you having pitting edema No    Is it Hard or Difficult finding clothes that fit No    Do you have infections No    Is there Decreased scar mobility Yes    [place]mer Sign No      Right Upper Extremity Lymphedema   10 cm Proximal to Olecranon Process 28.8 cm    Olecranon Process 24.7 cm    10 cm Proximal to Ulnar Styloid Process 22.4 cm    Just Proximal to Ulnar Styloid Process 15.9 cm    Across Hand at [employer] 20.4 cm    At [place] of 2nd Digit 5.9 cm      Left Upper Extremity Lymphedema   10 cm Proximal to Olecranon Process 31.5 cm    Olecranon Process 25.7 cm    10 cm Proximal to Ulnar Styloid Process 22.1 cm    Just Proximal to Ulnar Styloid Process 16.3 cm    Across Hand at [employer] 19.8 cm    At [place] of 2nd Digit 6 cm              Quick Dash - 04/02/20 0001    Open a tight or new jar Unable    Do heavy household chores (wash walls, wash floors) Unable     Carry a shopping bag or briefcase Unable  Wash your back Severe difficulty    Use a knife to cut food Severe difficulty    Recreational activities in which you take some force or impact through your arm, shoulder, or hand (golf, hammering, tennis) Unable    During the past week, to what extent has your arm, shoulder or hand problem interfered with your normal social activities with family, friends, neighbors, or groups? Extremely    During the past week, to what extent has your arm, shoulder or hand problem limited your work or other regular daily activities Quite a bit    Arm, shoulder, or hand pain. Severe    Tingling (pins and needles) in your arm, shoulder, or hand Severe    Difficulty Sleeping Severe difficulty    DASH Score 86.36 %                          PT Education - 04/02/20 1550    Education Details Lymphedema risk reduction and follow up care instructions including scar massage (only for axillary incision)    Person(s) Educated Patient    Methods Explanation;Demonstration;Handout    Comprehension Verbalized understanding;Returned demonstration               PT Long Term Goals - 04/02/20 1600      PT LONG TERM GOAL #1   Title Patient will demonstrate she has regained full shoulder ROM and function post operatively compared to baselines.    Time 4    Period Weeks    Status On-going    Target Date 04/30/20      PT LONG TERM GOAL #2   Title Patient will increase right shoulder active flexion to be >/= 150 degrees for increased ease reaching overhead.    Baseline 128    Time 4    Period Weeks    Status New    Target Date 04/30/20      PT LONG TERM GOAL #3   Title Patient will increase right shoulder active abduction to be >/= 160 degrees for increased ease obtaining radiation positioning.    Baseline 135    Time 4    Period Weeks    Status New    Target Date 04/30/20      PT LONG TERM GOAL #4   Title Patient will improve the DASH score  to be </= 10 for improved overall upper extremity function.    Baseline 86.36    Time 4    Period Weeks    Status New    Target Date 04/30/20                 Plan - 04/02/20 1555    Clinical Impression Statement Patient has significant edema and right shoulder limitations since her surgery on 03/07/2020. She underwent a right lumpectomy and sentinel node biopsy with a bilateral breast reduction. Her 2 lymph nodes were negative for carcinoma. She will bgin radiation next week. She will benefit from PT to regain shoulder ROM and function and for education on lymphedema risk reduction practices.    PT Frequency 2x / week    PT Duration 4 weeks    PT Treatment/Interventions ADLs/Self Care Home Management;Therapeutic exercise;Patient/family education;Manual techniques;Manual lymph drainage;Passive range of motion;Scar mobilization    PT Next Visit Plan PROM right shoulder; AAROM exercises; postural exercises    PT Home Exercise Plan Post op shoulder ROM HEP    Consulted and Agree with Plan of Care Patient  Patient will benefit from skilled therapeutic intervention in order to improve the following deficits and impairments:  Decreased knowledge of precautions, Impaired UE functional use, Pain, Decreased range of motion, Postural dysfunction, Increased edema, Decreased scar mobility  Visit Diagnosis: Malignant neoplasm of lower-inner quadrant of right breast of female, estrogen receptor positive ([place]) - Plan: PT plan of care cert/re-cert  Abnormal posture - Plan: PT plan of care cert/re-cert  Stiffness of right shoulder, not elsewhere classified - Plan: PT plan of care cert/re-cert  Localized edema - Plan: PT plan of care cert/re-cert     Problem List Patient Active Problem List   Diagnosis Date Noted  . Breast cancer ([place]) 03/07/2020  . Family history of pancreatic cancer 01/12/2020  . Ductal carcinoma in situ (DCIS) of right breast 01/06/2020   Annia Friendly, PT 04/02/20 4:05 PM  [place], [place], [address] Phone: [phone]   Fax:  [phone]  Name: OKLA QAZI MRN: [id number] Date of Birth: 20-Sep-1965

## 2020-04-02 NOTE — Patient Instructions (Signed)
° ° °          [facility]         [address], [place] [address]         [phone]         Annia Friendly, PT, CLT   After Breast Cancer Class It is recommended you attend the ABC class to be educated on lymphedema risk reduction. This class is free of charge and lasts for 1 hour. It is a 1-time class.  You are scheduled for December 6th at 11:00. We will send you a link. You need to download the webex app.  Scar massage You can begin gentle scar massage to the incision near your armpit using coconut oil a few minutes each day.   Compression garment Continue wearing your compression bra until your swelling is gone.   Home exercise Program Begin the exercises given before surgery to help begin regaining shoulder range of motion.   Follow up PT: It is recommended you return every 3 months for the first 3 years following surgery to be assessed on the SOZO machine for an L-Dex score. This helps prevent clinically significant lymphedema in 95% of patients. These follow up screens are 15 minute appointments that you are not billed for. You are scheduled for January 31st at 8:30 am.

## 2020-04-03 ENCOUNTER — Ambulatory Visit
Admission: RE | Admit: 2020-04-03 | Discharge: 2020-04-03 | Disposition: A | Discharge status: Home / Self Care | Payer: BC Managed Care – PPO | Source: Ambulatory Visit | Attending: Radiation Oncology | Admitting: Radiation Oncology

## 2020-04-03 ENCOUNTER — Encounter: Payer: Self-pay | Admitting: Radiation Oncology

## 2020-04-03 DIAGNOSIS — D0511 Intraductal carcinoma in situ of right breast: Secondary | ICD-10-CM

## 2020-04-03 NOTE — Progress Notes (Signed)
Radiation Oncology         [phone] ________________________________  Outpatient Follow Up - Conducted via telephone due to current COVID-19 concerns for limiting patient exposure  I spoke with the patient to conduct this consult visit via telephone to spare the patient unnecessary potential exposure in the healthcare setting during the current COVID-19 pandemic. The patient was notified in advance and was offered a [place] meeting to allow for face to face communication but unfortunately reported that they did not have the appropriate resources/technology to support such a visit and instead preferred to proceed with a telephone visit.   Name: Ariel Andersen        MRN: [id number]  Date of Service: 04/03/2020 DOB: October 20, 1965  CC:Harlan Stains, MD  Truitt Merle, MD     REFERRING PHYSICIAN: Truitt Merle, MD   DIAGNOSIS: The encounter diagnosis was Ductal carcinoma in situ (DCIS) of right breast.   HISTORY OF PRESENT ILLNESS: Ariel Andersen is a 54 y.o. female originally seen in the multidisciplinary breast clinic for a new diagnosis of right breast cancer. The patient was noted to have screening detected calcifications in the right breast. Diagnostic imaging revealed a 4 cm group of calcifications in the right breast. She underwent stereotactic biopsy on 01/04/20 that revealed an intermediate grade DCIS involving a papillary lesion and with associated ductal hyperplasia. Her cancer was ER/PR positive. She proceeded with lumpectomy on 03/07/20 with right lumpectomy and sentinel node biopsy due to the proximity of her upper outer quadrant, as well as oncoplastic bilateral reduction. Final pathology revealed a low grade DCIS less than 1 mm from the anterior margin. Her 2 sampled nodes were negative for disease, and her tumor was ER/PR positive. She is contacted today to discuss adjuvant radiotherapy. She's been cleared by plastic surgery as well to proceed with treatment.   PREVIOUS RADIATION  THERAPY: No   PAST MEDICAL HISTORY:  Past Medical History:  Diagnosis Date  . Arthritis of knee, left   . Breast cancer ([place])   . Family history of pancreatic cancer 01/12/2020  . Hypertension   . Pre-diabetes    per patient  . Seasonal allergies        PAST SURGICAL HISTORY: Past Surgical History:  Procedure Laterality Date  . BACK SURGERY  2017  . BREAST LUMPECTOMY WITH RADIOACTIVE SEED AND SENTINEL LYMPH NODE BIOPSY Right 03/07/2020   Procedure: RIGHT BREAST REDUCTION LUMPECTOMY WITH RADIOACTIVE SEED;  Surgeon: Jovita Kussmaul, MD;  Location: [facility] [place] [facility];  Service: General;  Laterality: Right;  . BREAST REDUCTION SURGERY Bilateral 03/07/2020   Procedure: MAMMARY REDUCTION  (BREAST) ONCOPLASTIC REDUCTION;  Surgeon: Wallace Going, DO;  Location: [facility];  Service: Plastics;  Laterality: Bilateral;  . SENTINEL NODE BIOPSY Right 03/07/2020   Procedure: RIGHT SENTINEL LYMPH NODE BIOPSY;  Surgeon: Jovita Kussmaul, MD;  Location: [facility] OR;  Service: General;  Laterality: Right;     FAMILY HISTORY:  Family History  Problem Relation Age of Onset  . Diabetes Mother   . Hyperlipidemia Mother   . Hypertension Mother   . Diabetes Father   . Hyperlipidemia Father   . Hypertension Father   . Stroke Father   . Pancreatic cancer Maternal Aunt        dx late 28s     SOCIAL HISTORY:  reports that she has never smoked. She has never used smokeless tobacco. She reports that she does not drink alcohol and does not use drugs. The patient is married and  works for [employer]. She lives in [place].    ALLERGIES: Patient has no known allergies.   MEDICATIONS:  Current Outpatient Medications  Medication Sig Dispense Refill  . acetaminophen (TYLENOL) 500 MG tablet Take 1 tablet (500 mg total) by mouth every 6 (six) hours as needed. For use AFTER surgery 30 tablet 0  . amLODipine (NORVASC) 5 MG tablet Take 5 mg by mouth daily.     Marland Kitchen aspirin 81 MG EC tablet Take 81 mg by mouth  daily.     Marland Kitchen atorvastatin (LIPITOR) 10 MG tablet Take 10 mg by mouth daily.    . Biotin 10000 MCG TABS Take 10,000 mcg by mouth daily.     . cholecalciferol (VITAMIN D) 1000 UNITS tablet Take 1,000 Units by mouth daily.    . ferrous sulfate 325 (65 FE) MG EC tablet Take 325 mg by mouth daily.     . fluocinolone (SYNALAR) 0.01 % external solution Apply 1 application topically daily as needed (scalp irritation).    . Multiple Vitamins-Minerals (MULTIVITAMIN WOMEN PO) Take 1 tablet by mouth daily.    Marland Kitchen triamcinolone cream (KENALOG) 0.1 % Apply 1 application topically daily as needed (eczema).     Marland Kitchen ibuprofen (ADVIL) 600 MG tablet Take 1 tablet (600 mg total) by mouth every 6 (six) hours as needed for mild pain or moderate pain. For use AFTER surgery (Patient not taking: Reported on 03/28/2020) 30 tablet 0   No current facility-administered medications for this encounter.     REVIEW OF SYSTEMS: On review of systems, the patient reports that she is doing well but has some tightness and soreness in her right arm with ROM and also in the breast has shooting pains from time to time. No other concerns are verbalized, but she admits she doesn't feel ready to drive due to concerns of her range of motion and has been working with PT. She requests a letter so she can work remotely through radiation.   PHYSICAL EXAM:  Unable to assess due to encounter type.   ECOG = 0  0 - Asymptomatic (Fully active, able to carry on all predisease activities without restriction)  1 - Symptomatic but completely ambulatory (Restricted in physically strenuous activity but ambulatory and able to carry out work of a light or sedentary nature. For example, light housework, office work)  2 - Symptomatic, <50% in bed during the day (Ambulatory and capable of all self care but unable to carry out any work activities. Up and about more than 50% of waking hours)  3 - Symptomatic, >50% in bed, but not bedbound (Capable of only  limited self-care, confined to bed or chair 50% or more of waking hours)  4 - Bedbound (Completely disabled. Cannot carry on any self-care. Totally confined to bed or chair)  5 - Death   Eustace Pen MM, Creech RH, Tormey DC, et al. [phone]). "Toxicity and response criteria of the [facility]". [place] Oncol. 5 (6): 649-55    LABORATORY DATA:  Lab Results  Component Value Date   WBC 5.2 03/01/2020   HGB 12.7 03/01/2020   HCT 42.7 03/01/2020   MCV 84.2 03/01/2020   PLT 214 03/01/2020   Lab Results  Component Value Date   NA 140 03/01/2020   K 3.2 (L) 03/01/2020   CL 101 03/01/2020   CO2 31 03/01/2020   Lab Results  Component Value Date   ALT 25 01/11/2020   AST 22 01/11/2020   ALKPHOS  61 01/11/2020   BILITOT 0.5 01/11/2020      RADIOGRAPHY: NM Sentinel Node Inj-No Rpt (Breast)  Result Date: 03/07/2020 Sulfur colloid was injected by the nuclear medicine technologist for melanoma sentinel node.       IMPRESSION/PLAN: 1. ER/PR positive, low grade DCIS of the right breast. Dr. Lisbeth Renshaw reviews her final pathology findings and  the nature of noninvasive breast disease. She is doing well postoperatively. We reviewed the rationale for adjuvant radiotherapy to reduce the risks of local recurrence. She plans to follow up with medical oncology for adjuvant antiestrogen therapy as well. We discussed the risks, benefits, short, and long term effects of radiotherapy, and the patient is interested in proceeding. Dr. Lisbeth Renshaw discusses the delivery and logistics of radiotherapy and recommends 6 1/2 weeks of radiotherapy. She will come in for simulation at which time we will check her incision site tomorrow afternoon and sign written consent to proceed.   Given current concerns for patient exposure during the COVID-19 pandemic, this encounter was conducted via telephone.  The patient has provided two factor identification and has given verbal consent for this type of  encounter and has been advised to only accept a meeting of this type in a secure network environment. The time spent during this encounter was 45 minutes including preparation, discussion, and coordination of the patient's care. The attendants for this meeting include Dr. Lisbeth Renshaw, Hayden Pedro  and Kizzie Furnish Gammel.  During the encounter, Dr. Lisbeth Renshaw, and Hayden Pedro were located at [facility] Radiation Oncology Department.  Ariel Andersen was located at home.     The above documentation reflects my direct findings during this shared patient visit. Please see the separate note by Dr. Lisbeth Renshaw on this date for the remainder of the patient's plan of care.    Carola Rhine, PAC

## 2020-04-04 ENCOUNTER — Other Ambulatory Visit: Payer: Self-pay

## 2020-04-04 ENCOUNTER — Ambulatory Visit
Admission: RE | Admit: 2020-04-04 | Discharge: 2020-04-04 | Disposition: A | Discharge status: Home / Self Care | Payer: BC Managed Care – PPO | Source: Ambulatory Visit | Attending: Radiation Oncology | Admitting: Radiation Oncology

## 2020-04-04 DIAGNOSIS — Z510 Encounter for antineoplastic radiation therapy: Secondary | ICD-10-CM | POA: Diagnosis not present

## 2020-04-04 DIAGNOSIS — D0511 Intraductal carcinoma in situ of right breast: Secondary | ICD-10-CM | POA: Insufficient documentation

## 2020-04-06 ENCOUNTER — Encounter: Payer: Self-pay | Admitting: Plastic Surgery

## 2020-04-06 ENCOUNTER — Encounter: Payer: Self-pay | Admitting: *Deleted

## 2020-04-06 ENCOUNTER — Other Ambulatory Visit: Payer: Self-pay

## 2020-04-06 ENCOUNTER — Ambulatory Visit (INDEPENDENT_AMBULATORY_CARE_PROVIDER_SITE_OTHER): Payer: BC Managed Care – PPO | Admitting: Plastic Surgery

## 2020-04-06 VITALS — BP 133/89 | HR 61 | Temp 98.3°F

## 2020-04-06 DIAGNOSIS — D0511 Intraductal carcinoma in situ of right breast: Secondary | ICD-10-CM

## 2020-04-06 NOTE — Progress Notes (Signed)
   Subjective:    Patient ID: Ariel Andersen, female    DOB: 21-May-1965, 54 y.o.   MRN: [id number]  The patient is a 54 year old female here for follow-up on her oncoplastic breast reduction.  She is likely going to start radiation therapy in the next few weeks.  She is healing nicely.  There is no sign of infection or seroma.  The Steri-Strips are still in place.  They can be removed if needed for the radiation.     Review of Systems  Constitutional: Negative.   HENT: Negative.   Eyes: Negative.   Respiratory: Negative.   Genitourinary: Negative.        Objective:   Physical Exam Vitals and nursing note reviewed.  Constitutional:      Appearance: Normal appearance.  HENT:     Head: Normocephalic and atraumatic.  Cardiovascular:     Rate and Rhythm: Normal rate.     Pulses: Normal pulses.  Neurological:     General: No focal deficit present.     Mental Status: She is alert. Mental status is at baseline.  Psychiatric:        Mood and Affect: Mood normal.        Behavior: Behavior normal.        Assessment & Plan:     ICD-10-CM   1. Ductal carcinoma in situ (DCIS) of right breast  D05.11     Follow-up after radiation in approximately 2 months.  Call with any questions or concerns.

## 2020-04-10 ENCOUNTER — Telehealth: Payer: Self-pay | Admitting: Hematology

## 2020-04-10 NOTE — Telephone Encounter (Signed)
Scheduled appts per 11/19 sch msg. Left voicemail with appt date and time.

## 2020-04-11 ENCOUNTER — Other Ambulatory Visit: Payer: Self-pay

## 2020-04-11 ENCOUNTER — Encounter: Payer: Self-pay | Admitting: Physical Therapy

## 2020-04-11 ENCOUNTER — Ambulatory Visit: Payer: BC Managed Care – PPO | Admitting: Physical Therapy

## 2020-04-11 DIAGNOSIS — C50311 Malignant neoplasm of lower-inner quadrant of right female breast: Secondary | ICD-10-CM | POA: Diagnosis not present

## 2020-04-11 DIAGNOSIS — M25611 Stiffness of right shoulder, not elsewhere classified: Secondary | ICD-10-CM

## 2020-04-11 DIAGNOSIS — Z510 Encounter for antineoplastic radiation therapy: Secondary | ICD-10-CM | POA: Diagnosis not present

## 2020-04-11 DIAGNOSIS — R293 Abnormal posture: Secondary | ICD-10-CM

## 2020-04-11 NOTE — Patient Instructions (Addendum)
SHOULDER: Flexion - Supine (Cane)        Cancer Rehab [phone]    Hold cane in both hands. Raise arms up overhead. Do not allow back to arch. Hold _5__ seconds. Do __5-10__ times; __1-2__ times a day.   SELF ASSISTED WITH OBJECT: Shoulder Abduction / Adduction - Supine    Hold cane with both hands. Move both arms from side to side, keep elbows straight.  Hold when stretch felt for __5__ seconds. Repeat __5-10__ times; __1-2__ times a day. Once this becomes easier progress to third picture bringing affected arm towards ear by staying out to side. Same hold for _5_seconds. Repeat  _5-10_ times, _1-2_ times/day.  Shoulder Blade Stretch    Clasp fingers behind head with elbows touching in front of face. Pull elbows back while pressing shoulder blades together. Relax and hold as tolerated, can place pillow under elbow here for comfort as needed and to allow for prolonged stretch.  Repeat __5__ times. Do __1-2__ sessions per day.        Over Head Pull: Narrow and Wide Grip   Cancer Rehab [phone]   On back, knees bent, feet flat, band across thighs, elbows straight but relaxed. Pull hands apart (start). Keeping elbows straight, bring arms up and over head, hands toward floor. Keep pull steady on band. Hold momentarily. Return slowly, keeping pull steady, back to start. Then do same with a wider grip on the band (past shoulder width) Repeat _5-10__ times. Band color __yellow____   Side Pull: Double Arm   On back, knees bent, feet flat. Arms perpendicular to body, shoulder level, elbows straight but relaxed. Pull arms out to sides, elbows straight. Resistance band comes across collarbones, hands toward floor. Hold momentarily. Slowly return to starting position. Repeat _5-10__ times. Band color _yellow____   Sword   On back, knees bent, feet flat, left hand on left hip, right hand above left. Pull right arm DIAGONALLY (hip to shoulder) across chest. Bring right arm along head toward  floor. Hold momentarily. Slowly return to starting position. Repeat _5-10__ times. Do with left arm. Band color _yellow_____   Shoulder Rotation: Double Arm   On back, knees bent, feet flat, elbows tucked at sides, bent 90, hands palms up. Pull hands apart and down toward floor, keeping elbows near sides. Hold momentarily. Slowly return to starting position. Repeat _5-10__ times. Band color __yellow____

## 2020-04-11 NOTE — Therapy (Signed)
[place], [place], [address] Phone: [phone]   Fax:  [phone]  Physical Therapy Treatment  Patient Details  Name: Ariel Andersen MRN: [id number] Date of Birth: 03-27-1966 Referring Provider (PT): Dr. Autumn Messing   Encounter Date: 04/11/2020   PT End of Session - 04/11/20 1145    Visit Number 3    Number of Visits 10    Date for PT Re-Evaluation 04/30/20    PT Start Time 1100    PT Stop Time 1140    PT Time Calculation (min) 40 min    Activity Tolerance Patient tolerated treatment well    Behavior During Therapy [facility] for tasks assessed/performed           Past Medical History:  Diagnosis Date  . Arthritis of knee, left   . Breast cancer ([place])   . Family history of pancreatic cancer 01/12/2020  . Hypertension   . Pre-diabetes    per patient  . Seasonal allergies     Past Surgical History:  Procedure Laterality Date  . BACK SURGERY  2017  . BREAST LUMPECTOMY WITH RADIOACTIVE SEED AND SENTINEL LYMPH NODE BIOPSY Right 03/07/2020   Procedure: RIGHT BREAST REDUCTION LUMPECTOMY WITH RADIOACTIVE SEED;  Surgeon: Jovita Kussmaul, MD;  Location: [place];  Service: General;  Laterality: Right;  . BREAST REDUCTION SURGERY Bilateral 03/07/2020   Procedure: MAMMARY REDUCTION  (BREAST) ONCOPLASTIC REDUCTION;  Surgeon: Wallace Going, DO;  Location: [facility];  Service: Plastics;  Laterality: Bilateral;  . SENTINEL NODE BIOPSY Right 03/07/2020   Procedure: RIGHT SENTINEL LYMPH NODE BIOPSY;  Surgeon: Jovita Kussmaul, MD;  Location: [place];  Service: General;  Laterality: Right;    There were no vitals filed for this visit.   Subjective Assessment - 04/11/20 1105    Subjective Pt states she has been doing some exercises at home and her shoulders are better.  She starts radiation on Monday.  She is her compression bra during the day and her binder at night. She is scheduled to attend ABC class on Dec 6     Pertinent History Patient was diagnosed on 12/05/2019 with right intermediate grade DCIS. She underwent a right lumpectomy and sentinel node biopsy on 03/07/2020 with 2 negative nodes removed and a bilateral breast reduction. It is ER/PR positive.    Patient Stated Goals Get my arm moving again.    Currently in Pain? Yes   lower back   Pain Score 5    she did not sleep well, pt has history of back surgery             [facility] PT Assessment - 04/11/20 0001      AROM   Right Shoulder Flexion 175 Degrees    Right Shoulder ABduction 173 Degrees    Left Shoulder Flexion 175 Degrees    Left Shoulder ABduction 175 Degrees                         [facility] Adult PT Treatment/Exercise - 04/11/20 0001      Exercises   Exercises Neck;Shoulder      Neck Exercises: Seated   Other Seated Exercise neck and upper thoracic ROM gave pt Youtube link for lymphatic flow series with [employer]       Shoulder Exercises: Supine   Horizontal ABduction Strengthening;Right;Left;5 reps;Theraband    Theraband Level (Shoulder Horizontal ABduction) Level 1 (Yellow)    External Rotation Strengthening;Right;Left;5 reps;Theraband  Theraband Level (Shoulder External Rotation) Level 1 (Yellow)    Flexion Strengthening;Both;5 reps;Theraband   wide and narrow grip    Theraband Level (Shoulder Flexion) Level 1 (Yellow)    Diagonals Strengthening;Both;5 reps;Theraband    Theraband Level (Shoulder Diagonals) Level 1 (Yellow)    Other Supine Exercises dowel rod flexion, abduction and butterfly stretch                   PT Education - 04/11/20 1144    Education Details supine dowel rod streches and supine scapular exercises with progression eventually to standing with red theraband    Person(s) Educated Patient    Methods Explanation;Demonstration;Handout    Comprehension Verbalized understanding;Returned demonstration               PT Long Term Goals - 04/11/20 1147      PT LONG TERM  GOAL #1   Title Patient will demonstrate she has regained full shoulder ROM and function post operatively compared to baselines.    Status Achieved      PT LONG TERM GOAL #2   Title Patient will increase right shoulder active flexion to be >/= 150 degrees for increased ease reaching overhead.    Baseline 128    Status Achieved      PT LONG TERM GOAL #3   Title Patient will increase right shoulder active abduction to be >/= 160 degrees for increased ease obtaining radiation positioning.    Baseline 135    Status Achieved      PT LONG TERM GOAL #4   Title Patient will improve the DASH score to be </= 10 for improved overall upper extremity function.    Baseline 86.36    Status Deferred   forgot to asses, but pt states she has no problems                Plan - 04/11/20 1145    Clinical Impression Statement Pt has made significant improvment in her shoulder ROM and is preparing to start radiation next week.  She was instructed in stretching and strengthening exercises today and how to progress.  She is ready to discharge from skilled PT at this time and knows that she can ask for a prescription to return should she have difficutly after radiation    Stability/Clinical Decision Making Stable/Uncomplicated    PT Next Visit Plan Discharge this episode    PT Home Exercise Plan Post op shoulder ROM HEP, dowel stretches, scapular strengthening    Consulted and Agree with Plan of Care Patient           Patient will benefit from skilled therapeutic intervention in order to improve the following deficits and impairments:  Decreased knowledge of precautions, Impaired UE functional use, Pain, Decreased range of motion, Postural dysfunction, Increased edema, Decreased scar mobility  Visit Diagnosis: Abnormal posture  Stiffness of right shoulder, not elsewhere classified     Problem List Patient Active Problem List   Diagnosis Date Noted  . Breast cancer ([place]) 03/07/2020  . Family  history of pancreatic cancer 01/12/2020  . Ductal carcinoma in situ (DCIS) of right breast 01/06/2020     PHYSICAL THERAPY DISCHARGE SUMMARY  Visits from Start of Care: 3  Current functional level related to goals / functional outcomes: Pt independent   Remaining deficits: Continuing to recover from breast surgery and will have radiation    Education / Equipment: Home exercise, lymphedema risk reduction  Plan: Patient agrees to discharge.  Patient goals  were partially met. Patient is being discharged due to being pleased with the current functional level.  ?????          Donato Heinz. Owens Shark PT  [place]wood Levo 04/11/2020, 11:51 AM  [place], [place], [address] Phone: [phone]   Fax:  [phone]  Name: SHIANE WENBERG MRN: [id number] Date of Birth: [birth year]

## 2020-04-16 ENCOUNTER — Ambulatory Visit
Admission: RE | Admit: 2020-04-16 | Discharge: 2020-04-16 | Disposition: A | Discharge status: Home / Self Care | Payer: BC Managed Care – PPO | Source: Ambulatory Visit | Attending: Radiation Oncology | Admitting: Radiation Oncology

## 2020-04-16 ENCOUNTER — Encounter: Payer: Self-pay | Admitting: Physical Therapy

## 2020-04-16 DIAGNOSIS — Z510 Encounter for antineoplastic radiation therapy: Secondary | ICD-10-CM | POA: Diagnosis not present

## 2020-04-17 ENCOUNTER — Other Ambulatory Visit: Payer: Self-pay

## 2020-04-17 ENCOUNTER — Inpatient Hospital Stay: Payer: BC Managed Care – PPO | Attending: Radiation Oncology

## 2020-04-17 ENCOUNTER — Ambulatory Visit
Admission: RE | Admit: 2020-04-17 | Discharge: 2020-04-17 | Disposition: A | Discharge status: Home / Self Care | Payer: BC Managed Care – PPO | Source: Ambulatory Visit | Attending: Radiation Oncology | Admitting: Radiation Oncology

## 2020-04-17 DIAGNOSIS — Z23 Encounter for immunization: Secondary | ICD-10-CM

## 2020-04-17 DIAGNOSIS — Z510 Encounter for antineoplastic radiation therapy: Secondary | ICD-10-CM | POA: Diagnosis not present

## 2020-04-18 ENCOUNTER — Ambulatory Visit
Admission: RE | Admit: 2020-04-18 | Discharge: 2020-04-18 | Disposition: A | Discharge status: Home / Self Care | Payer: BC Managed Care – PPO | Source: Ambulatory Visit | Attending: Radiation Oncology | Admitting: Radiation Oncology

## 2020-04-18 DIAGNOSIS — D0511 Intraductal carcinoma in situ of right breast: Secondary | ICD-10-CM | POA: Insufficient documentation

## 2020-04-19 ENCOUNTER — Ambulatory Visit
Admission: RE | Admit: 2020-04-19 | Discharge: 2020-04-19 | Disposition: A | Discharge status: Home / Self Care | Payer: BC Managed Care – PPO | Source: Ambulatory Visit | Attending: Radiation Oncology | Admitting: Radiation Oncology

## 2020-04-19 DIAGNOSIS — D0511 Intraductal carcinoma in situ of right breast: Secondary | ICD-10-CM | POA: Diagnosis not present

## 2020-04-19 NOTE — Progress Notes (Signed)
Pt here for patient teaching.  Pt given Radiation and You booklet, skin care instructions, Alra deodorant and Radiaplex gel.  Reviewed areas of pertinence such as fatigue, hair loss, skin changes, breast tenderness and breast swelling . Pt able to give teach back of to pat skin and use unscented/gentle soap,apply Radiaplex bid, avoid applying anything to skin within 4 hours of treatment, avoid wearing an under wire bra and to use an electric razor if they must shave. Pt verbalizes understanding of information given and will contact nursing with any questions or concerns.     LaToya M. Silva RN, BSN      

## 2020-04-20 ENCOUNTER — Ambulatory Visit
Admission: RE | Admit: 2020-04-20 | Discharge: 2020-04-20 | Disposition: A | Discharge status: Home / Self Care | Payer: BC Managed Care – PPO | Source: Ambulatory Visit | Attending: Radiation Oncology | Admitting: Radiation Oncology

## 2020-04-20 DIAGNOSIS — D0511 Intraductal carcinoma in situ of right breast: Secondary | ICD-10-CM

## 2020-04-20 MED ORDER — ALRA NON-METALLIC DEODORANT (RAD-ONC)
1.0000 "application " | Freq: Once | TOPICAL | Status: AC
  Administered 2020-04-20: 1 via TOPICAL

## 2020-04-20 MED ORDER — RADIAPLEXRX EX GEL: Freq: Once | CUTANEOUS | Status: AC

## 2020-04-23 ENCOUNTER — Ambulatory Visit
Admission: RE | Admit: 2020-04-23 | Discharge: 2020-04-23 | Disposition: A | Discharge status: Home / Self Care | Payer: BC Managed Care – PPO | Source: Ambulatory Visit | Attending: Radiation Oncology | Admitting: Radiation Oncology

## 2020-04-23 DIAGNOSIS — D0511 Intraductal carcinoma in situ of right breast: Secondary | ICD-10-CM | POA: Diagnosis not present

## 2020-04-24 ENCOUNTER — Ambulatory Visit
Admission: RE | Admit: 2020-04-24 | Discharge: 2020-04-24 | Disposition: A | Discharge status: Home / Self Care | Payer: BC Managed Care – PPO | Source: Ambulatory Visit | Attending: Radiation Oncology | Admitting: Radiation Oncology

## 2020-04-24 DIAGNOSIS — D0511 Intraductal carcinoma in situ of right breast: Secondary | ICD-10-CM | POA: Diagnosis not present

## 2020-04-25 ENCOUNTER — Other Ambulatory Visit: Payer: Self-pay

## 2020-04-25 ENCOUNTER — Encounter: Payer: Self-pay | Admitting: Physical Therapy

## 2020-04-25 ENCOUNTER — Ambulatory Visit
Admission: RE | Admit: 2020-04-25 | Discharge: 2020-04-25 | Disposition: A | Discharge status: Home / Self Care | Payer: BC Managed Care – PPO | Source: Ambulatory Visit | Attending: Radiation Oncology | Admitting: Radiation Oncology

## 2020-04-25 DIAGNOSIS — D0511 Intraductal carcinoma in situ of right breast: Secondary | ICD-10-CM | POA: Diagnosis not present

## 2020-04-26 ENCOUNTER — Ambulatory Visit
Admission: RE | Admit: 2020-04-26 | Discharge: 2020-04-26 | Disposition: A | Discharge status: Home / Self Care | Payer: BC Managed Care – PPO | Source: Ambulatory Visit | Attending: Radiation Oncology | Admitting: Radiation Oncology

## 2020-04-26 DIAGNOSIS — D0511 Intraductal carcinoma in situ of right breast: Secondary | ICD-10-CM | POA: Diagnosis not present

## 2020-04-27 ENCOUNTER — Ambulatory Visit
Admission: RE | Admit: 2020-04-27 | Discharge: 2020-04-27 | Disposition: A | Discharge status: Home / Self Care | Payer: BC Managed Care – PPO | Source: Ambulatory Visit | Attending: Radiation Oncology | Admitting: Radiation Oncology

## 2020-04-27 DIAGNOSIS — D0511 Intraductal carcinoma in situ of right breast: Secondary | ICD-10-CM | POA: Diagnosis not present

## 2020-04-30 ENCOUNTER — Ambulatory Visit
Admission: RE | Admit: 2020-04-30 | Discharge: 2020-04-30 | Disposition: A | Discharge status: Home / Self Care | Payer: BC Managed Care – PPO | Source: Ambulatory Visit | Attending: Radiation Oncology | Admitting: Radiation Oncology

## 2020-04-30 DIAGNOSIS — D0511 Intraductal carcinoma in situ of right breast: Secondary | ICD-10-CM | POA: Diagnosis not present

## 2020-05-01 ENCOUNTER — Ambulatory Visit
Admission: RE | Admit: 2020-05-01 | Discharge: 2020-05-01 | Disposition: A | Discharge status: Home / Self Care | Payer: BC Managed Care – PPO | Source: Ambulatory Visit | Attending: Radiation Oncology | Admitting: Radiation Oncology

## 2020-05-01 DIAGNOSIS — D0511 Intraductal carcinoma in situ of right breast: Secondary | ICD-10-CM | POA: Diagnosis not present

## 2020-05-02 ENCOUNTER — Ambulatory Visit
Admission: RE | Admit: 2020-05-02 | Discharge: 2020-05-02 | Disposition: A | Discharge status: Home / Self Care | Payer: BC Managed Care – PPO | Source: Ambulatory Visit | Attending: Radiation Oncology | Admitting: Radiation Oncology

## 2020-05-02 DIAGNOSIS — D0511 Intraductal carcinoma in situ of right breast: Secondary | ICD-10-CM | POA: Diagnosis not present

## 2020-05-03 ENCOUNTER — Other Ambulatory Visit: Payer: Self-pay

## 2020-05-03 ENCOUNTER — Ambulatory Visit
Admission: RE | Admit: 2020-05-03 | Discharge: 2020-05-03 | Disposition: A | Discharge status: Home / Self Care | Payer: BC Managed Care – PPO | Source: Ambulatory Visit | Attending: Radiation Oncology | Admitting: Radiation Oncology

## 2020-05-03 DIAGNOSIS — D0511 Intraductal carcinoma in situ of right breast: Secondary | ICD-10-CM | POA: Diagnosis not present

## 2020-05-04 ENCOUNTER — Ambulatory Visit
Admission: RE | Admit: 2020-05-04 | Discharge: 2020-05-04 | Disposition: A | Discharge status: Home / Self Care | Payer: BC Managed Care – PPO | Source: Ambulatory Visit | Attending: Radiation Oncology | Admitting: Radiation Oncology

## 2020-05-04 ENCOUNTER — Other Ambulatory Visit: Payer: Self-pay

## 2020-05-04 DIAGNOSIS — D0511 Intraductal carcinoma in situ of right breast: Secondary | ICD-10-CM | POA: Diagnosis not present

## 2020-05-07 ENCOUNTER — Ambulatory Visit
Admission: RE | Admit: 2020-05-07 | Discharge: 2020-05-07 | Disposition: A | Discharge status: Home / Self Care | Payer: BC Managed Care – PPO | Source: Ambulatory Visit | Attending: Radiation Oncology | Admitting: Radiation Oncology

## 2020-05-07 DIAGNOSIS — D0511 Intraductal carcinoma in situ of right breast: Secondary | ICD-10-CM | POA: Diagnosis not present

## 2020-05-08 ENCOUNTER — Ambulatory Visit
Admission: RE | Admit: 2020-05-08 | Discharge: 2020-05-08 | Disposition: A | Discharge status: Home / Self Care | Payer: BC Managed Care – PPO | Source: Ambulatory Visit | Attending: Radiation Oncology | Admitting: Radiation Oncology

## 2020-05-08 DIAGNOSIS — D0511 Intraductal carcinoma in situ of right breast: Secondary | ICD-10-CM | POA: Diagnosis not present

## 2020-05-09 ENCOUNTER — Ambulatory Visit
Admission: RE | Admit: 2020-05-09 | Discharge: 2020-05-09 | Disposition: A | Discharge status: Home / Self Care | Payer: BC Managed Care – PPO | Source: Ambulatory Visit | Attending: Radiation Oncology | Admitting: Radiation Oncology

## 2020-05-09 DIAGNOSIS — D0511 Intraductal carcinoma in situ of right breast: Secondary | ICD-10-CM | POA: Diagnosis not present

## 2020-05-10 ENCOUNTER — Ambulatory Visit: Payer: BC Managed Care – PPO | Admitting: Radiation Oncology

## 2020-05-10 ENCOUNTER — Ambulatory Visit
Admission: RE | Admit: 2020-05-10 | Discharge: 2020-05-10 | Disposition: A | Discharge status: Home / Self Care | Payer: BC Managed Care – PPO | Source: Ambulatory Visit | Attending: Radiation Oncology | Admitting: Radiation Oncology

## 2020-05-10 DIAGNOSIS — D0511 Intraductal carcinoma in situ of right breast: Secondary | ICD-10-CM | POA: Diagnosis not present

## 2020-05-14 ENCOUNTER — Ambulatory Visit
Admission: RE | Admit: 2020-05-14 | Discharge: 2020-05-14 | Disposition: A | Discharge status: Home / Self Care | Payer: BC Managed Care – PPO | Source: Ambulatory Visit | Attending: Radiation Oncology | Admitting: Radiation Oncology

## 2020-05-14 DIAGNOSIS — D0511 Intraductal carcinoma in situ of right breast: Secondary | ICD-10-CM | POA: Diagnosis not present

## 2020-05-15 ENCOUNTER — Ambulatory Visit: Payer: BC Managed Care – PPO

## 2020-05-16 ENCOUNTER — Ambulatory Visit
Admission: RE | Admit: 2020-05-16 | Discharge: 2020-05-16 | Disposition: A | Discharge status: Home / Self Care | Payer: BC Managed Care – PPO | Source: Ambulatory Visit | Attending: Radiation Oncology | Admitting: Radiation Oncology

## 2020-05-16 DIAGNOSIS — D0511 Intraductal carcinoma in situ of right breast: Secondary | ICD-10-CM | POA: Diagnosis not present

## 2020-05-17 ENCOUNTER — Ambulatory Visit
Admission: RE | Admit: 2020-05-17 | Discharge: 2020-05-17 | Disposition: A | Discharge status: Home / Self Care | Payer: BC Managed Care – PPO | Source: Ambulatory Visit | Attending: Radiation Oncology | Admitting: Radiation Oncology

## 2020-05-17 DIAGNOSIS — D0511 Intraductal carcinoma in situ of right breast: Secondary | ICD-10-CM | POA: Diagnosis not present

## 2020-05-21 ENCOUNTER — Other Ambulatory Visit: Payer: Self-pay

## 2020-05-21 ENCOUNTER — Ambulatory Visit
Admission: RE | Admit: 2020-05-21 | Discharge: 2020-05-21 | Disposition: A | Discharge status: Home / Self Care | Payer: BC Managed Care – PPO | Source: Ambulatory Visit | Attending: Radiation Oncology | Admitting: Radiation Oncology

## 2020-05-21 DIAGNOSIS — D0511 Intraductal carcinoma in situ of right breast: Secondary | ICD-10-CM | POA: Insufficient documentation

## 2020-05-22 ENCOUNTER — Other Ambulatory Visit: Payer: Self-pay

## 2020-05-22 ENCOUNTER — Ambulatory Visit
Admission: RE | Admit: 2020-05-22 | Discharge: 2020-05-22 | Disposition: A | Discharge status: Home / Self Care | Payer: BC Managed Care – PPO | Source: Ambulatory Visit | Attending: Radiation Oncology | Admitting: Radiation Oncology

## 2020-05-22 DIAGNOSIS — D0511 Intraductal carcinoma in situ of right breast: Secondary | ICD-10-CM | POA: Diagnosis not present

## 2020-05-23 ENCOUNTER — Ambulatory Visit: Payer: BC Managed Care – PPO

## 2020-05-23 ENCOUNTER — Ambulatory Visit
Admission: RE | Admit: 2020-05-23 | Discharge: 2020-05-23 | Disposition: A | Discharge status: Home / Self Care | Payer: BC Managed Care – PPO | Source: Ambulatory Visit | Attending: Radiation Oncology | Admitting: Radiation Oncology

## 2020-05-23 DIAGNOSIS — D0511 Intraductal carcinoma in situ of right breast: Secondary | ICD-10-CM | POA: Diagnosis not present

## 2020-05-24 ENCOUNTER — Ambulatory Visit: Payer: BC Managed Care – PPO

## 2020-05-24 ENCOUNTER — Ambulatory Visit
Admission: RE | Admit: 2020-05-24 | Discharge: 2020-05-24 | Disposition: A | Discharge status: Home / Self Care | Payer: BC Managed Care – PPO | Source: Ambulatory Visit | Attending: Radiation Oncology | Admitting: Radiation Oncology

## 2020-05-24 DIAGNOSIS — D0511 Intraductal carcinoma in situ of right breast: Secondary | ICD-10-CM | POA: Diagnosis not present

## 2020-05-25 ENCOUNTER — Ambulatory Visit
Admission: RE | Admit: 2020-05-25 | Discharge: 2020-05-25 | Disposition: A | Discharge status: Home / Self Care | Payer: BC Managed Care – PPO | Source: Ambulatory Visit | Attending: Radiation Oncology | Admitting: Radiation Oncology

## 2020-05-25 ENCOUNTER — Other Ambulatory Visit: Payer: Self-pay

## 2020-05-25 DIAGNOSIS — D0511 Intraductal carcinoma in situ of right breast: Secondary | ICD-10-CM | POA: Diagnosis not present

## 2020-05-28 ENCOUNTER — Ambulatory Visit
Admission: RE | Admit: 2020-05-28 | Discharge: 2020-05-28 | Disposition: A | Discharge status: Home / Self Care | Payer: BC Managed Care – PPO | Source: Ambulatory Visit | Attending: Radiation Oncology | Admitting: Radiation Oncology

## 2020-05-28 DIAGNOSIS — D0511 Intraductal carcinoma in situ of right breast: Secondary | ICD-10-CM | POA: Diagnosis not present

## 2020-05-29 ENCOUNTER — Ambulatory Visit
Admission: RE | Admit: 2020-05-29 | Discharge: 2020-05-29 | Disposition: A | Discharge status: Home / Self Care | Payer: BC Managed Care – PPO | Source: Ambulatory Visit | Attending: Radiation Oncology | Admitting: Radiation Oncology

## 2020-05-29 DIAGNOSIS — D0511 Intraductal carcinoma in situ of right breast: Secondary | ICD-10-CM | POA: Diagnosis not present

## 2020-05-29 NOTE — Progress Notes (Signed)
[place]   [phone] Fax:([phone]   Clinic Follow up Note   Patient Care Team: Ariel Stains, MD as PCP - General (Family Medicine) Ariel Germany, RN as Oncology Nurse Navigator Ariel Kaufmann, RN as Oncology Nurse Navigator Ariel Merle, MD as Consulting Physician (Hematology) Ariel Kussmaul, MD as Consulting Physician (General Surgery) Ariel Rudd, MD as Consulting Physician (Radiation Oncology)  Date of Service:  05/31/2020  CHIEF COMPLAINT: F/u of right breast DCIS  SUMMARY OF ONCOLOGIC HISTORY: Oncology History Overview Note  Cancer Staging Ductal carcinoma in situ (DCIS) of right breast Staging form: Breast, AJCC 8th Edition - Clinical stage from 01/11/2020: Stage 0 (cTis (DCIS), cN0, cM0, ER+, PR+) - Unsigned    Ductal carcinoma in situ (DCIS) of right breast  12/05/2019 Mammogram   Diagnostic Mammogram 12/05/19  IMPRESSION The 4cm span of segmental pleomorphic calcifications in the right breast lower inner aspect middle depth 7cm from the nipple are suspicious of malignancy.   01/04/2020 Initial Biopsy   Diagnosis  Breast, right, needle core biopsy, RLMQ, middle depth, 7cmfn -DUCTAL CARCINOMA IN SITU INVOLVING A PAPILLARY LESION -USUAL DUCTAL HYPERPLASIA AND COLUMNAR CELL CHANGE -SEE COMMENT   01/04/2020 Receptors her2   ER - 95% positive moderate staining  PR - 99% positive strong staining     01/06/2020 Initial Diagnosis   Ductal carcinoma in situ (DCIS) of right breast   03/07/2020 Surgery   RIGHT BREAST REDUCTION LUMPECTOMY WITH RADIOACTIVE SEED and RIGHT SENTINEL LYMPH NODE BIOPSY by Dr Ariel Andersen   MAMMARY REDUCTION  (BREAST) ONCOPLASTIC REDUCTION by Dr Ariel Andersen    03/07/2020 Pathology Results   FINAL MICROSCOPIC DIAGNOSIS:   A. BREAST, RIGHT, LUMPECTOMY:  -  Ductal carcinoma in situ, low grade, 3.4 cm  -  Ductal carcinoma involves multiple papillary lesions  -  Margins uninvolved by carcinoma (<0.1 cm; anterior  margin)  -  Previous biopsy site changes  -  See oncology table and comment below   B. LYMPH NODE, RIGHT AXILLARY, SENTINEL, EXCISION:  -  No carcinoma identified in one lymph node (0/1)   C. LYMPH NODE, RIGHT AXILLARY, SENTINEL, EXCISION:  -  No carcinoma identified in one lymph node (0/1)   D. BREAST, LEFT, MAMMOPLASTY:  -  Usual ductal hyperplasia and fibrocystic changes with apocrine  metaplasia  -  Fibroadenomatoid nodule  -  No carcinoma identified  -  See comment   E. BREAST, RIGHT, MAMMOPLASTY:  -  Adenosis and fibrocystic changes with apocrine metaplasia  -  No carcinoma identified   F. BREAST, RIGHT ADDITIONAL INFERIOR MARGIN, EXCISION:  -  Atypical ductal hyperplasia involving intraductal papillomas  -  Lobular neoplasm (atypical lobular hyperplasia  -  See comment   COMMENT:   A.  The resection specimen has multiple papillary lesions, several of  which are involved by ductal carcinoma in situ.  Immunohistochemistry  (SMM, calponin, p63 and cytokeratin 5/6) was utilized to assess for an  invasive component and atypia at the inked resection margins.  Cytokeratin 5/6 is negative and foci of ductal carcinoma in situ.  Myoepithelial markers are retained supporting the absence of an invasive  component.   D.  Cytokeratin 5/6 is positive and/or has mosaic pattern expression and  foci of usual ductal hyperplasia.   E.  AMENDMENT NOTE: DIAGNOSTIC INFORMATION HAS NOT BEEN CHANGED. The  original report had a typographical error in part E.  The letter "F" was  inserted prior to the diagnosis.  This has  been removed.   F. Cytokeratin 5/6 is negative and foci of atypical ductal hyperplasia.  Myoepithelial markers (SMM, p63 and calponin) are retained supporting  the absence of an invasive component.  E-cadherin is negative in the  foci of lobular neoplasia.    04/16/2020 - 06/05/2020 Radiation Therapy   Adjuvant Radiation with Dr Ariel Andersen       CURRENT THERAPY:   Adjuvant Radiation with Dr Ariel Andersen   INTERVAL HISTORY:  Ariel Andersen is here for a follow up. She presents to the clinic with her husband.  She is tolerating radiation well overall, with mild fatigue and skin erythema, she functions normally.  She will complete radiation next week. she denies any pain, or other concerns.  Her last menstrual period was in November 2021, she has mild hot flash. All other systems were reviewed with the patient and are negative.  MEDICAL HISTORY:  Past Medical History:  Diagnosis Date  . Arthritis of knee, left   . Breast cancer ([name])   . Family history of pancreatic cancer 01/12/2020  . Hypertension   . Pre-diabetes    per patient  . Seasonal allergies     SURGICAL HISTORY: Past Surgical History:  Procedure Laterality Date  . BACK SURGERY  2017  . BREAST LUMPECTOMY WITH RADIOACTIVE SEED AND SENTINEL LYMPH NODE BIOPSY Right 03/07/2020   Procedure: RIGHT BREAST REDUCTION LUMPECTOMY WITH RADIOACTIVE SEED;  Surgeon: Ariel Kussmaul, MD;  Location: [facility];  Service: General;  Laterality: Right;  . BREAST REDUCTION SURGERY Bilateral 03/07/2020   Procedure: MAMMARY REDUCTION  (BREAST) ONCOPLASTIC REDUCTION;  Surgeon: Ariel Going, DO;  Location: [facility];  Service: Plastics;  Laterality: Bilateral;  . SENTINEL NODE BIOPSY Right 03/07/2020   Procedure: RIGHT SENTINEL LYMPH NODE BIOPSY;  Surgeon: Ariel Kussmaul, MD;  Location: [facility];  Service: General;  Laterality: Right;    I have reviewed the social history and family history with the patient and they are unchanged from previous note.  ALLERGIES:  has No Known Allergies.  MEDICATIONS:  Current Outpatient Medications  Medication Sig Dispense Refill  . acetaminophen (TYLENOL) 500 MG tablet Take 1 tablet (500 mg total) by mouth every 6 (six) hours as needed. For use AFTER surgery 30 tablet 0  . amLODipine (NORVASC) 5 MG tablet Take 5 mg by mouth daily.     Marland Kitchen aspirin 81 MG EC tablet Take 81 mg by  mouth daily.     Marland Kitchen atorvastatin (LIPITOR) 10 MG tablet Take 10 mg by mouth daily.    . Biotin 10000 MCG TABS Take 10,000 mcg by mouth daily.     . cholecalciferol (VITAMIN D) 1000 UNITS tablet Take 1,000 Units by mouth daily.    . ferrous sulfate 325 (65 FE) MG EC tablet Take 325 mg by mouth daily.     . fluocinolone (SYNALAR) 0.01 % external solution Apply 1 application topically daily as needed (scalp irritation).    Marland Kitchen ibuprofen (ADVIL) 600 MG tablet Take 1 tablet (600 mg total) by mouth every 6 (six) hours as needed for mild pain or moderate pain. For use AFTER surgery 30 tablet 0  . Multiple Vitamins-Minerals (MULTIVITAMIN WOMEN PO) Take 1 tablet by mouth daily.    Marland Kitchen triamcinolone cream (KENALOG) 0.1 % Apply 1 application topically daily as needed (eczema).      No current facility-administered medications for this visit.    PHYSICAL EXAMINATION: ECOG PERFORMANCE STATUS: 0 - Asymptomatic  Vitals:   05/31/20 0851  BP: Marland Kitchen)  158/95  Pulse: 79  Resp: 16  Temp: 97.7 F (36.5 C)  SpO2: 99%   Filed Weights   05/31/20 0851  Weight: 191 lb 9.6 oz (86.9 kg)    GENERAL:alert, no distress and comfortable SKIN: skin color, texture, turgor are normal, no rashes or significant lesions EYES: normal, Conjunctiva are pink and non-injected, sclera clear NECK: supple, thyroid normal size, non-tender, without nodularity LYMPH:  no palpable lymphadenopathy in the cervical, axillary  LUNGS: clear to auscultation and percussion with normal breathing effort HEART: regular rate & rhythm and no murmurs and no lower extremity edema ABDOMEN:abdomen soft, non-tender and normal bowel sounds Musculoskeletal:no cyanosis of digits and no clubbing  NEURO: alert & oriented x 3 with fluent speech, no focal motor/sensory deficits Breasts: Breast inspection showed them to be symmetrical with no nipple discharge.  Status post right lumpectomy and left breast reduction surgery.  Incisions well healed very well.   Diffuse skin hyperpigmentation of right breast from radiation, no skin ulcers.  There is a lemon size lump in the upper inner quadrant of right breast, probably a seroma, no other palpable breast masses or lymphadenopathy.   LABORATORY DATA:  I have reviewed the data as listed CBC Latest Ref Rng & Units 03/01/2020 01/11/2020 09/07/2013  WBC 4.0 - 10.5 K/uL 5.2 5.8 6.7  Hemoglobin 12.0 - 15.0 g/dL 12.7 11.6(L) 12.5  Hematocrit 36.0 - 46.0 % 42.7 38.0 38.8  Platelets 150 - 400 K/uL 214 206 236     CMP Latest Ref Rng & Units 03/01/2020 01/11/2020 09/07/2013  Glucose 70 - 99 mg/dL 143(H) 185(H) 81  BUN 6 - 20 mg/dL '7 8 9  ' Creatinine 0.44 - 1.00 mg/dL 0.73 0.85 0.72  Sodium 135 - 145 mmol/L 140 138 142  Potassium 3.5 - 5.1 mmol/L 3.2(L) 3.4(L) 3.3(L)  Chloride 98 - 111 mmol/L 101 102 102  CO2 22 - 32 mmol/L '31 30 26  ' Calcium 8.9 - 10.3 mg/dL 9.5 9.7 9.5  Total Protein 6.5 - 8.1 g/dL - 6.8 7.7  Total Bilirubin 0.3 - 1.2 mg/dL - 0.5 0.2(L)  Alkaline Phos 38 - 126 U/L - 61 61  AST 15 - 41 U/L - 22 24  ALT 0 - 44 U/L - 25 21      RADIOGRAPHIC STUDIES: I have personally reviewed the radiological images as listed and agreed with the findings in the report. No results found.   ASSESSMENT & PLAN:  Ariel Andersen is a 55 y.o. female with    1. Right breast DCIS, Intermediate grade II, ER+/PR+, ADH, LDH  -She was diagnosed in 12/2019 with 4cm DCIS in right breast.  -She underwent right breast lumpectomy and SLNB by Dr Ariel Andersen on 03/07/20. I reviewed her surgical path with opt which showed a 3.4cm of low grade DCIS and it was completely resected, LN negative.  There is also atypical ductal and lobular hyperplasia in the right breast tissue, left breast pathology was negative. -Her DCIS was cured by complete surgical resection. Any form of adjuvant therapy is preventive. -Although she has low-grade DCIS, she does not have other high risk features including ADH and LDH, which will predict high  risk of breast cancer in the future. -She proceeded with adjuvant Radiation with Dr Ariel Andersen starting 04/16/20. She plans to complete on 06/05/20.  -Given the ER and PR positive DCIS, and her perimenopausal I recommend tamoxifen for 5 years as chemoprevention, which will reduce her future breast cancer risk by 40-50%.  -- The potential  side effects, which includes but not limited to, hot flash, skin and vaginal dryness, slightly increased risk of cardiovascular disease and cataract, small risk of thrombosis and endometrial cancer, were discussed with her in great details. Preventive strategies for thrombosis, such as being physically active, using compression stocks, avoid cigarette smoking, etc., were reviewed with her. I also recommend her to follow-up with her gynecologist once a year, and watch for vaginal spotting or bleeding, as a clinically sign of endometrial cancer, etc. She voiced good understanding, and would like to think about it. -She will call me with her decision.  -Was scheduled for fourth shipper visit in 3 months  2. Comorbidities: HTN, Pre-diabetic, arthritis, Sleep Apnea  -She is on medication for HTN and has not required medication for diabetes.  -She has chronic arthritis. She has undergone lumbar spine surgery around 2017 and back pain now occasional. She planned to do left knee surgery in 01/2020 but this has been postponed given recent breast cancer diagnosis.  -I recommend sleep study to further evaluate her sleep apnea. She may need CPAP machine.    PLAN:  -She will complete radiation next week -I recommended tamoxifen, she will think about it and call back with -Survivorship clinic with Lacie in 3 months   No problem-specific Assessment & Plan notes found for this encounter.   No orders of the defined types were placed in this encounter.  All questions were answered. The patient knows to call the clinic with any problems, questions or concerns. No barriers to  learning was detected. The total time spent in the appointment was 30 minutes.     Ariel Merle, MD 05/31/2020   I, Joslyn Devon, am acting as scribe for Ariel Merle, MD.   I have reviewed the above documentation for accuracy and completeness, and I agree with the above.

## 2020-05-30 ENCOUNTER — Ambulatory Visit
Admission: RE | Admit: 2020-05-30 | Discharge: 2020-05-30 | Disposition: A | Discharge status: Home / Self Care | Payer: BC Managed Care – PPO | Source: Ambulatory Visit | Attending: Radiation Oncology | Admitting: Radiation Oncology

## 2020-05-30 DIAGNOSIS — D0511 Intraductal carcinoma in situ of right breast: Secondary | ICD-10-CM | POA: Diagnosis not present

## 2020-05-31 ENCOUNTER — Ambulatory Visit
Admission: RE | Admit: 2020-05-31 | Discharge: 2020-05-31 | Disposition: A | Discharge status: Home / Self Care | Payer: BC Managed Care – PPO | Source: Ambulatory Visit | Attending: Radiation Oncology | Admitting: Radiation Oncology

## 2020-05-31 ENCOUNTER — Inpatient Hospital Stay: Payer: BC Managed Care – PPO | Attending: Radiation Oncology | Admitting: Hematology

## 2020-05-31 ENCOUNTER — Telehealth: Payer: Self-pay | Admitting: Hematology

## 2020-05-31 ENCOUNTER — Encounter: Payer: Self-pay | Admitting: *Deleted

## 2020-05-31 ENCOUNTER — Encounter: Payer: Self-pay | Admitting: Hematology

## 2020-05-31 ENCOUNTER — Other Ambulatory Visit: Payer: Self-pay

## 2020-05-31 VITALS — BP 158/95 | HR 79 | Temp 97.7°F | Resp 16 | Ht 66.0 in | Wt 191.6 lb

## 2020-05-31 DIAGNOSIS — R7303 Prediabetes: Secondary | ICD-10-CM | POA: Diagnosis not present

## 2020-05-31 DIAGNOSIS — Z170 Estrogen receptor positive status [ER+]: Secondary | ICD-10-CM | POA: Insufficient documentation

## 2020-05-31 DIAGNOSIS — Z79899 Other long term (current) drug therapy: Secondary | ICD-10-CM | POA: Diagnosis not present

## 2020-05-31 DIAGNOSIS — G4730 Sleep apnea, unspecified: Secondary | ICD-10-CM | POA: Insufficient documentation

## 2020-05-31 DIAGNOSIS — Z800 Family history of malignant neoplasm of digestive organs: Secondary | ICD-10-CM | POA: Diagnosis not present

## 2020-05-31 DIAGNOSIS — D0511 Intraductal carcinoma in situ of right breast: Secondary | ICD-10-CM | POA: Insufficient documentation

## 2020-05-31 DIAGNOSIS — M1990 Unspecified osteoarthritis, unspecified site: Secondary | ICD-10-CM | POA: Diagnosis not present

## 2020-05-31 DIAGNOSIS — I10 Essential (primary) hypertension: Secondary | ICD-10-CM | POA: Insufficient documentation

## 2020-05-31 NOTE — Telephone Encounter (Signed)
Scheduled appointments per 11/3 los. Spoke to patient who is aware of appointment date and time.  

## 2020-06-01 ENCOUNTER — Ambulatory Visit: Payer: BC Managed Care – PPO

## 2020-06-01 ENCOUNTER — Ambulatory Visit
Admission: RE | Admit: 2020-06-01 | Discharge: 2020-06-01 | Disposition: A | Discharge status: Home / Self Care | Payer: BC Managed Care – PPO | Source: Ambulatory Visit | Attending: Radiation Oncology | Admitting: Radiation Oncology

## 2020-06-01 DIAGNOSIS — D0511 Intraductal carcinoma in situ of right breast: Secondary | ICD-10-CM | POA: Diagnosis not present

## 2020-06-04 ENCOUNTER — Ambulatory Visit: Payer: BC Managed Care – PPO

## 2020-06-05 ENCOUNTER — Ambulatory Visit: Payer: BC Managed Care – PPO

## 2020-06-06 ENCOUNTER — Encounter: Payer: Self-pay | Admitting: Radiation Oncology

## 2020-06-06 ENCOUNTER — Ambulatory Visit
Admission: RE | Admit: 2020-06-06 | Discharge: 2020-06-06 | Disposition: A | Discharge status: Home / Self Care | Payer: BC Managed Care – PPO | Source: Ambulatory Visit | Attending: Radiation Oncology | Admitting: Radiation Oncology

## 2020-06-06 DIAGNOSIS — D0511 Intraductal carcinoma in situ of right breast: Secondary | ICD-10-CM | POA: Diagnosis not present

## 2020-06-08 ENCOUNTER — Encounter: Payer: Self-pay | Admitting: Plastic Surgery

## 2020-06-08 ENCOUNTER — Other Ambulatory Visit: Payer: Self-pay

## 2020-06-08 ENCOUNTER — Ambulatory Visit (INDEPENDENT_AMBULATORY_CARE_PROVIDER_SITE_OTHER): Payer: BC Managed Care – PPO | Admitting: Surgical

## 2020-06-08 VITALS — BP 138/87 | HR 78 | Temp 98.4°F | Ht 64.0 in | Wt 187.0 lb

## 2020-06-08 DIAGNOSIS — Z923 Personal history of irradiation: Secondary | ICD-10-CM | POA: Diagnosis not present

## 2020-06-08 DIAGNOSIS — D0511 Intraductal carcinoma in situ of right breast: Secondary | ICD-10-CM

## 2020-06-08 NOTE — Progress Notes (Signed)
   Subjective:     Patient ID: Ariel Andersen, female    DOB: 06-16-1965, 55 y.o.   MRN: [id number]  Chief Complaint  Patient presents with  . Follow-up    HPI: The patient is a 55 y.o. female here for follow-up after bilateral oncoplastic reduction.  Patient had DCIS of right breast and underwent lumpectomy by General surgery followed by oncoplastic reduction with Dr. Marla Roe on 03/07/2020.  Patient just completed radiation therapy 2 days ago.  She reports overall she is doing well.  She is overall pleased with how things are doing.  She does report some tenderness of her right breast and some areas of firmness cause her some pain, 4 out of 10.  She is not having any infectious symptoms.  No wounds.  No drainage noted.  Review of Systems  Constitutional: Negative.   Respiratory: Negative.   Skin: Negative.      Objective:   Vital Signs BP 138/87 (BP Location: Left Arm, Patient Position: Sitting, Cuff Size: Normal)   Pulse 78   Temp 98.4 F (36.9 C) (Temporal)   Ht 5\' 4"  (1.626 m)   Wt 187 lb (84.8 kg)   LMP 06/05/2020 (Exact Date)   SpO2 100%   BMI 32.10 kg/m  Vital Signs and Nursing Note Reviewed Chaperone present Physical Exam Constitutional:      General: She is not in acute distress.    Appearance: Normal appearance. She is not ill-appearing or diaphoretic.  HENT:     Head: Normocephalic and atraumatic.  Pulmonary:     Effort: Pulmonary effort is normal.  Chest:    Skin:    General: Skin is warm and dry.  Neurological:     General: No focal deficit present.     Mental Status: She is alert and oriented to person, place, and time.  Psychiatric:        Mood and Affect: Mood normal.        Behavior: Behavior normal.       Assessment/Plan:     ICD-10-CM   1. Ductal carcinoma in situ (DCIS) of right breast  D05.11   2. S/P radiation therapy  Z92.3     Recommend starting massage in 1 week to right breast using Vaseline, unscented lotion or  radiation cream provided by radiation oncology.  Recommend massaging 2-3 times per daily in a circular motion with mild firmness to help break up any fat necrosis.  We discussed options for further intervention in the future.  We discussed that with her history of radiation that she is at increased risk of complications after surgery to her breasts which we would plan to further discuss at a later date depending on if further surgical intervention was necessary.  We recommend a follow-up in 6 months for reevaluation.  There is no sign of seroma, hematoma, infection on exam.  Overall she is healed very nicely.  Recommend calling with questions or concerns. Follow up scheduled for 6 months to reevaluate and discuss further possibilities.  Pictures were obtained of the patient and placed in the chart with the patient's or guardian's permission.   Carola Rhine Dhaval Woo, PA-C 06/08/2020, 11:11 AM

## 2020-06-13 NOTE — Progress Notes (Signed)
  Radiation Oncology         [phone] ________________________________  Name: Ariel Andersen MRN: [id number]  Date: 06/06/2020  DOB: 07-24-1965  End of Treatment Note  Diagnosis:   right-sided breast cancer     Indication for treatment:  Curative       Radiation treatment dates:   04/16/20 - 06/07/19  Site/dose:   The patient initially received a dose of 50 Gy in 25 fractions to the breast using whole-breast tangent fields. This was delivered using a 3-D conformal technique. The patient then received a boost to the seroma. This delivered an additional 14.4 Gy in 8 fractions using a 3-field photon boost technique. The total dose was 64.4 Gy.   Narrative: The patient tolerated radiation treatment relatively well.   The patient had some expected skin irritation as she progressed during treatment.    Plan: The patient has completed radiation treatment. The patient will return to radiation oncology clinic for routine followup in one month. I advised the patient to call or return sooner if they have any questions or concerns related to their recovery or treatment. ________________________________  Jodelle Gross, M.D., Ph.D.

## 2020-06-18 ENCOUNTER — Other Ambulatory Visit: Payer: Self-pay

## 2020-06-18 ENCOUNTER — Ambulatory Visit: Payer: BC Managed Care – PPO | Attending: General Surgery

## 2020-06-18 DIAGNOSIS — C50311 Malignant neoplasm of lower-inner quadrant of right female breast: Secondary | ICD-10-CM | POA: Insufficient documentation

## 2020-06-18 DIAGNOSIS — Z170 Estrogen receptor positive status [ER+]: Secondary | ICD-10-CM | POA: Insufficient documentation

## 2020-06-18 NOTE — Therapy (Signed)
[place], [place], [address] Phone: [phone]   Fax:  [phone]  Physical Therapy Treatment  Patient Details  Name: Ariel Andersen MRN: [id number] Date of Birth: 12-24-1965 Referring Provider (PT): Dr. Autumn Messing   Encounter Date: 06/18/2020   PT End of Session - 06/18/20 0838    Visit Number 3   # unchanged due to screen only   Number of Visits 10    Date for PT Re-Evaluation 04/30/20    PT Start Time 0831    PT Stop Time 0839    PT Time Calculation (min) 8 min    Activity Tolerance Patient tolerated treatment well    Behavior During Therapy [facility] for tasks assessed/performed           Past Medical History:  Diagnosis Date  . Arthritis of knee, left   . Breast cancer ([place])   . Family history of pancreatic cancer 01/12/2020  . Hypertension   . Pre-diabetes    per patient  . Seasonal allergies     Past Surgical History:  Procedure Laterality Date  . BACK SURGERY  2017  . BREAST LUMPECTOMY WITH RADIOACTIVE SEED AND SENTINEL LYMPH NODE BIOPSY Right 03/07/2020   Procedure: RIGHT BREAST REDUCTION LUMPECTOMY WITH RADIOACTIVE SEED;  Surgeon: Jovita Kussmaul, MD;  Location: [facility];  Service: General;  Laterality: Right;  . BREAST REDUCTION SURGERY Bilateral 03/07/2020   Procedure: MAMMARY REDUCTION  (BREAST) ONCOPLASTIC REDUCTION;  Surgeon: Wallace Going, DO;  Location: [facility];  Service: Plastics;  Laterality: Bilateral;  . SENTINEL NODE BIOPSY Right 03/07/2020   Procedure: RIGHT SENTINEL LYMPH NODE BIOPSY;  Surgeon: Jovita Kussmaul, MD;  Location: [facility];  Service: General;  Laterality: Right;    There were no vitals filed for this visit.   Subjective Assessment - 06/18/20 0834    Subjective Pt returns for 3 month L-Dex screen.    Pertinent History Patient was diagnosed on 12/05/2019 with right intermediate grade DCIS. She underwent a right lumpectomy and sentinel node biopsy on  03/07/2020 with 2 negative nodes removed and a bilateral breast reduction. It is ER/PR positive.                  L-DEX FLOWSHEETS - 06/18/20 0800      L-DEX LYMPHEDEMA SCREENING   Measurement Type Unilateral    L-DEX MEASUREMENT EXTREMITY Upper Extremity    POSITION  Standing    DOMINANT SIDE Right    At Risk Side Right    BASELINE SCORE (UNILATERAL) -5.9    L-DEX SCORE (UNILATERAL) -4.5    VALUE CHANGE (UNILAT) 1.4                                  PT Long Term Goals - 04/11/20 1147      PT LONG TERM GOAL #1   Title Patient will demonstrate she has regained full shoulder ROM and function post operatively compared to baselines.    Status Achieved      PT LONG TERM GOAL #2   Title Patient will increase right shoulder active flexion to be >/= 150 degrees for increased ease reaching overhead.    Baseline 128    Status Achieved      PT LONG TERM GOAL #3   Title Patient will increase right shoulder active abduction to be >/= 160 degrees for increased ease obtaining radiation positioning.  Baseline 135    Status Achieved      PT LONG TERM GOAL #4   Title Patient will improve the DASH score to be </= 10 for improved overall upper extremity function.    Baseline 86.36    Status Deferred   forgot to asses, but pt states she has no problems                Plan - 06/18/20 0844    Clinical Impression Statement Pt returns for her first 3 month L-dex screen since her SLNB surgery. Her change from baseline of 1.4 is WNLs so no further treatme is required at this time except to cont every 3 month L-Dex screens which pt is agreeable to.    Stability/Clinical Decision Making Stable/Uncomplicated    Rehab Potential Excellent    PT Frequency 2x / week    PT Duration 4 weeks    PT Treatment/Interventions ADLs/Self Care Home Management;Therapeutic exercise;Patient/family education;Manual techniques;Manual lymph drainage;Passive range of motion;Scar  mobilization    PT Next Visit Plan Cont every 3 month L-Dex screens.    Consulted and Agree with Plan of Care Patient           Patient will benefit from skilled therapeutic intervention in order to improve the following deficits and impairments:  Decreased knowledge of precautions,Impaired UE functional use,Pain,Decreased range of motion,Postural dysfunction,Increased edema,Decreased scar mobility  Visit Diagnosis: Malignant neoplasm of lower-inner quadrant of right breast of female, estrogen receptor positive ([place])     Problem List Patient Active Problem List   Diagnosis Date Noted  . Family history of pancreatic cancer 01/12/2020  . Ductal carcinoma in situ (DCIS) of right breast 01/06/2020    Otelia Limes, PTA 06/18/2020, 9:10 AM  [place], [place], [address] Phone: [phone]   Fax:  [phone]  Name: Ariel Andersen MRN: [id number] Date of Birth: 01-27-1966

## 2020-06-27 NOTE — Progress Notes (Signed)
  Radiation Oncology         [phone] ________________________________  Name: Ariel Andersen MRN: [id number]  Date of Service: 07/02/2020  DOB: Mar 18, 1966  Post Treatment Telephone Note  Diagnosis:  right-sided breast cancer    Interval Since Last Radiation:  4 weeks   04/16/20 - 06/06/20,The patient initially received a dose of 50 Gy in 25 fractions to the breast using whole-breast tangent fields. This was delivered using a 3-D conformal technique. The patient then received a boost to the seroma. This delivered an additional 14.4 Gy in 8 fractions using a 3-field photon boost technique. The total dose was 64.4 Gy.    Narrative:  The patient was contacted today for routine follow-up. During treatment she did very well with radiotherapy and did not have significant desquamation. She reports she still has some breast tenderness that was to be expected. Patient states that her fatigue is improving and that her skin is looking better post treatment.  Impression/Plan: 1. right-sided breast cancer , The patient has been doing well since completion of radiotherapy. We discussed that we would be happy to continue to follow her as needed, but she will also continue to follow up with Dr. Burr Medico in medical oncology. She was counseled on skin care as well as measures to avoid sun exposure to this area.  2. Survivorship. We discussed the importance of survivorship evaluation and encouraged her to attend her upcoming visit with that clinic.     Loma Messing, LPN

## 2020-07-02 ENCOUNTER — Telehealth: Payer: Self-pay

## 2020-07-02 ENCOUNTER — Ambulatory Visit
Admission: RE | Admit: 2020-07-02 | Discharge: 2020-07-02 | Disposition: A | Discharge status: Home / Self Care | Payer: BC Managed Care – PPO | Source: Ambulatory Visit | Attending: Radiation Oncology | Admitting: Radiation Oncology

## 2020-07-02 DIAGNOSIS — D0511 Intraductal carcinoma in situ of right breast: Secondary | ICD-10-CM

## 2020-07-02 NOTE — Telephone Encounter (Signed)
Spoke with patient in regards to post radiation treatment symptoms. TM

## 2020-07-12 ENCOUNTER — Telehealth: Payer: Self-pay | Admitting: Nurse Practitioner

## 2020-07-12 NOTE — Telephone Encounter (Signed)
Moved upcoming appointment due to provider's template. Patient is aware and stated that her left ankle and both feet were swollen starting last Wednesday. Over the weekend, the right foot went down and the left went down a little but it's still noticeable. The left ankle and foot is taking longer to go down. Sent message to nurse.

## 2020-08-27 ENCOUNTER — Other Ambulatory Visit: Payer: Self-pay

## 2020-08-27 ENCOUNTER — Inpatient Hospital Stay: Payer: BC Managed Care – PPO | Attending: Radiation Oncology | Admitting: Nurse Practitioner

## 2020-08-27 ENCOUNTER — Encounter: Payer: Self-pay | Admitting: Nurse Practitioner

## 2020-08-27 VITALS — BP 149/97 | HR 80 | Temp 97.8°F | Resp 20 | Ht 64.0 in | Wt 191.7 lb

## 2020-08-27 DIAGNOSIS — Z923 Personal history of irradiation: Secondary | ICD-10-CM | POA: Insufficient documentation

## 2020-08-27 DIAGNOSIS — D0511 Intraductal carcinoma in situ of right breast: Secondary | ICD-10-CM | POA: Diagnosis not present

## 2020-08-27 DIAGNOSIS — Z170 Estrogen receptor positive status [ER+]: Secondary | ICD-10-CM | POA: Insufficient documentation

## 2020-08-27 MED ORDER — TAMOXIFEN CITRATE 20 MG PO TABS: 20.0000 mg | ORAL_TABLET | Freq: Every day | ORAL | 1 refills | Status: DC

## 2020-08-27 NOTE — Progress Notes (Signed)
CLINIC:  Survivorship   Patient Care Team: Harlan Stains, MD as PCP - General (Family Medicine) Rockwell Germany, RN as Oncology Nurse Navigator Mauro Kaufmann, RN as Oncology Nurse Navigator Truitt Merle, MD as Consulting Physician (Hematology) Jovita Kussmaul, MD as Consulting Physician (General Surgery) Kyung Rudd, MD as Consulting Physician (Radiation Oncology) Alla Feeling, NP as Nurse Practitioner (Nurse Practitioner)  REASON FOR VISIT:  Routine follow-up post-treatment for a recent history of breast cancer.  BRIEF ONCOLOGIC HISTORY:  Oncology History Overview Note  Cancer Staging Ductal carcinoma in situ (DCIS) of right breast Staging form: Breast, AJCC 8th Edition - Clinical stage from 01/11/2020: Stage 0 (cTis (DCIS), cN0, cM0, ER+, PR+) - Unsigned    Ductal carcinoma in situ (DCIS) of right breast  12/05/2019 Mammogram   Diagnostic Mammogram 12/05/19  IMPRESSION The 4cm span of segmental pleomorphic calcifications in the right breast lower inner aspect middle depth 7cm from the nipple are suspicious of malignancy.   01/04/2020 Initial Biopsy   Diagnosis  Breast, right, needle core biopsy, RLMQ, middle depth, 7cmfn -DUCTAL CARCINOMA IN SITU INVOLVING A PAPILLARY LESION -USUAL DUCTAL HYPERPLASIA AND COLUMNAR CELL CHANGE -SEE COMMENT   01/04/2020 Receptors her2   ER - 95% positive moderate staining  PR - 99% positive strong staining     01/06/2020 Initial Diagnosis   Ductal carcinoma in situ (DCIS) of right breast   03/07/2020 Surgery   RIGHT BREAST REDUCTION LUMPECTOMY WITH RADIOACTIVE SEED and RIGHT SENTINEL LYMPH NODE BIOPSY by Dr Marlou Starks   MAMMARY REDUCTION  (BREAST) ONCOPLASTIC REDUCTION by Dr Marla Roe    03/07/2020 Pathology Results   FINAL MICROSCOPIC DIAGNOSIS:   A. BREAST, RIGHT, LUMPECTOMY:  -  Ductal carcinoma in situ, low grade, 3.4 cm  -  Ductal carcinoma involves multiple papillary lesions  -  Margins uninvolved by carcinoma (<0.1 cm;  anterior margin)  -  Previous biopsy site changes  -  See oncology table and comment below   B. LYMPH NODE, RIGHT AXILLARY, SENTINEL, EXCISION:  -  No carcinoma identified in one lymph node (0/1)   C. LYMPH NODE, RIGHT AXILLARY, SENTINEL, EXCISION:  -  No carcinoma identified in one lymph node (0/1)   D. BREAST, LEFT, MAMMOPLASTY:  -  Usual ductal hyperplasia and fibrocystic changes with apocrine  metaplasia  -  Fibroadenomatoid nodule  -  No carcinoma identified  -  See comment   E. BREAST, RIGHT, MAMMOPLASTY:  -  Adenosis and fibrocystic changes with apocrine metaplasia  -  No carcinoma identified   F. BREAST, RIGHT ADDITIONAL INFERIOR MARGIN, EXCISION:  -  Atypical ductal hyperplasia involving intraductal papillomas  -  Lobular neoplasm (atypical lobular hyperplasia  -  See comment   COMMENT:   A.  The resection specimen has multiple papillary lesions, several of  which are involved by ductal carcinoma in situ.  Immunohistochemistry  (SMM, calponin, p63 and cytokeratin 5/6) was utilized to assess for an  invasive component and atypia at the inked resection margins.  Cytokeratin 5/6 is negative and foci of ductal carcinoma in situ.  Myoepithelial markers are retained supporting the absence of an invasive  component.   D.  Cytokeratin 5/6 is positive and/or has mosaic pattern expression and  foci of usual ductal hyperplasia.   E.  AMENDMENT NOTE: DIAGNOSTIC INFORMATION HAS NOT BEEN CHANGED. The  original report had a typographical error in part E.  The letter "F" was  inserted prior to the diagnosis.  This has been removed.  F. Cytokeratin 5/6 is negative and foci of atypical ductal hyperplasia.  Myoepithelial markers (SMM, p63 and calponin) are retained supporting  the absence of an invasive component.  E-cadherin is negative in the  foci of lobular neoplasia.    03/07/2020 Cancer Staging   Staging form: Breast, AJCC 8th Edition - Pathologic stage from 03/07/2020:  Stage 0 (pTis (DCIS), pN0, cM0, G1, ER+, PR+, HER2: Not Assessed) - Signed by Alla Feeling, NP on 08/27/2020 Histologic grading system: 3 grade system   04/16/2020 - 06/05/2020 Radiation Therapy   Adjuvant Radiation with Dr Lisbeth Renshaw    08/27/2020 -  Anti-estrogen oral therapy   Tamoxifen 20 mg po once daily   08/27/2020 Survivorship   SCP delivered by Cira Rue, NP     INTERVAL HISTORY:  Ms. Elem presents to the [facility] today for our initial meeting to review her survivorship care plan detailing her treatment course for breast cancer, as well as monitoring long-term side effects of that treatment, education regarding health maintenance, screening, and overall wellness and health promotion.     Overall, Ms. Worrel reports doing well overall.  She has not started tamoxifen, she has questions to discuss.  Few weeks ago she developed pain in the right breast that is worse at night, unsure what caused this.  It causes fitful sleep.  Denies nipple discharge, inversion, breast erythema, swelling, or warmth.  She has arthritis in her knees, not limiting function.  All other systems reviewed with patient and are negative.   ONCOLOGY TREATMENT TEAM:  1. Surgeon:  Dr. Marlou Starks at [facility] 2. Medical Oncologist: Dr. Burr Medico  3. Radiation Oncologist: Dr. Lisbeth Renshaw     PAST MEDICAL/SURGICAL HISTORY:  Past Medical History:  Diagnosis Date  . Arthritis of knee, left   . Breast cancer ([place])   . Family history of pancreatic cancer 01/12/2020  . Hypertension   . Pre-diabetes    per patient  . Seasonal allergies    Past Surgical History:  Procedure Laterality Date  . BACK SURGERY  2017  . BREAST LUMPECTOMY WITH RADIOACTIVE SEED AND SENTINEL LYMPH NODE BIOPSY Right 03/07/2020   Procedure: RIGHT BREAST REDUCTION LUMPECTOMY WITH RADIOACTIVE SEED;  Surgeon: Jovita Kussmaul, MD;  Location: [place];  Service: General;  Laterality: Right;  . BREAST REDUCTION SURGERY Bilateral  03/07/2020   Procedure: MAMMARY REDUCTION  (BREAST) ONCOPLASTIC REDUCTION;  Surgeon: Wallace Going, DO;  Location: [place];  Service: Plastics;  Laterality: Bilateral;  . SENTINEL NODE BIOPSY Right 03/07/2020   Procedure: RIGHT SENTINEL LYMPH NODE BIOPSY;  Surgeon: Jovita Kussmaul, MD;  Location: [facility];  Service: General;  Laterality: Right;     ALLERGIES:  No Known Allergies   CURRENT MEDICATIONS:  Outpatient Encounter Medications as of 08/27/2020  Medication Sig Note  . acetaminophen (TYLENOL) 500 MG tablet Take 1 tablet (500 mg total) by mouth every 6 (six) hours as needed. For use AFTER surgery   . amLODipine (NORVASC) 10 MG tablet Take 1 tablet by mouth daily.   Marland Kitchen aspirin 81 MG EC tablet Take 81 mg by mouth daily.    Marland Kitchen atorvastatin (LIPITOR) 10 MG tablet Take 10 mg by mouth daily.   . Biotin 10000 MCG TABS Take 10,000 mcg by mouth daily.    . Blood Glucose Monitoring Suppl (CONTOUR NEXT ONE) KIT See admin instructions.   . Cholecalciferol (VITAMIN D3 MAXIMUM STRENGTH PO) 1 capsule   . CONTOUR NEXT TEST test strip daily.   . ferrous  sulfate 325 (65 FE) MG EC tablet Take 325 mg by mouth daily.    . fluocinolone (SYNALAR) 0.01 % external solution Apply 1 application topically daily as needed (scalp irritation).   . hydrochlorothiazide (HYDRODIURIL) 12.5 MG tablet 1 tablet in the morning   . HYDROcodone-acetaminophen (NORCO/VICODIN) 5-325 MG tablet hydrocodone 5 mg-acetaminophen 325 mg tablet   . ibuprofen (ADVIL) 600 MG tablet Take 1 tablet (600 mg total) by mouth every 6 (six) hours as needed for mild pain or moderate pain. For use AFTER surgery 02/21/2020: For use after procedure  . metFORMIN (GLUCOPHAGE-XR) 500 MG 24 hr tablet SMARTSIG:1 Tablet(s) By Mouth Every Evening   . Microlet Lancets MISC daily.   . Multiple Vitamins-Minerals (MULTIVITAMIN WOMEN PO) Take 1 tablet by mouth daily.   . tamoxifen (NOLVADEX) 20 MG tablet Take 1 tablet (20 mg total) by mouth daily.   Marland Kitchen  triamcinolone cream (KENALOG) 0.1 % Apply 1 application topically daily as needed (eczema).    . [DISCONTINUED] amLODipine (NORVASC) 5 MG tablet Take 5 mg by mouth daily.    . [DISCONTINUED] cholecalciferol (VITAMIN D) 1000 UNITS tablet Take 1,000 Units by mouth daily.    No facility-administered encounter medications on file as of 08/27/2020.     ONCOLOGIC FAMILY HISTORY:  Family History  Problem Relation Age of Onset  . Diabetes Mother   . Hyperlipidemia Mother   . Hypertension Mother   . Diabetes Father   . Hyperlipidemia Father   . Hypertension Father   . Stroke Father   . Pancreatic cancer Maternal Aunt        dx late 28s     GENETIC COUNSELING/TESTING: None  SOCIAL HISTORY:  JACKELYNE SAYER is married and lives alone/with her spouse [place].  Ms. Johann is currently working.  She denies any current or history of tobacco, alcohol, or illicit drug use.     PHYSICAL EXAMINATION:  Vital Signs:   Vitals:   08/27/20 1227  BP: (!) 149/97  Pulse: 80  Resp: 20  Temp: 97.8 F (36.6 C)  SpO2: 100%   Filed Weights   08/27/20 1227  Weight: 191 lb 11.2 oz (87 kg)   General: Well-nourished, well-appearing female in no acute distress.   HEENT:   Sclerae anicteric.  Lymph: No cervical, supraclavicular, or infraclavicular lymphadenopathy noted on palpation.  Respiratory: breathing non-labored.  Neuro: No focal deficits. Steady gait.  Psych: Mood and affect normal and appropriate for situation.  Extremities: No edema. MSK: No focal spinal tenderness to palpation.  Full range of motion in bilateral upper extremities Skin: Warm and dry. Breast exam: S/p right lumpectomy and bilateral mammoplasty.  Incisions completely healed.  There is a large mass in the right medial breast likely a seroma.  No other palpable mass in either breast or axilla that I could appreciate  LABORATORY DATA:  None for this visit.  DIAGNOSTIC IMAGING:  None for this visit.       ASSESSMENT AND PLAN:  Ms.. Hammes is a pleasant 55 y.o. female with Stage 0 right breast DCIS, ER+/PR+, diagnosed in 12/2019, treated with lumpectomy and adjuvant radiation therapy.  She presents to the Survivorship Clinic for our initial meeting and routine follow-up post-completion of treatment for breast cancer.    1. 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 Chandra is recovering from definitive treatment for breast cancer. She will follow-up with her medical oncologist, Dr. Burr Medico in 11/2020 with history and physical exam per surveillance  protocol.  She was reluctant to start anti estrogen due to side effect profile.  I answered her questions today to her satisfaction.  We reviewed her DCIS recurrence calculator using the [facility] tool which is 7% risk for recurrence in 10 years without antiestrogen which I do not feel is accurate given her ADH and ALH were not factored into the tool.  I feel she has a higher recurrence risk than this calculator has predicted and I strongly encouraged her to consider tamoxifen.  She is willing to try, it was prescribed today.  If she is not able to tolerate or decides to come off tamoxifen I would recommend to intensify the surveillance plan by adding annual screening breast MRI staggered 6 months apart from mammograms.  She is agreeable.   She was instructed to make Dr. Burr Medico or myself aware if she begins to experience any worsening side effects of the medication and I could see her back in clinic to help manage those side effects, as needed. Though the incidence is low, there is an associated risk of endometrial cancer with anti-estrogen therapies like Tamoxifen.  She was referred to OB/GYN for routine monitoring.  Ms. Sherrin was encouraged to contact Dr. Burr Medico or myself with any vaginal bleeding while taking Tamoxifen. Other side effects of Tamoxifen were again reviewed with her as well.   She has what I feel is a seroma in the  right breast which is painful, I am referring her for right ultrasound to further evaluate.  We discussed potential referral back to Dr. Marlou Starks for drainage.  I will call her with results.  Today, a comprehensive survivorship care plan and treatment summary was reviewed with the patient today detailing her breast cancer diagnosis, treatment course, potential late/long-term effects of treatment, appropriate follow-up care with recommendations for the future, and patient education resources.  A copy of this summary, along with a letter will be sent to the patient's primary care provider via In Basket message after today's visit.   2. Bone health:  Given Ms. Knipfer's age, perimenopausal status, and tamoxifen she is not at high risk for bone demineralization.  We can postpone DEXA until she becomes postmenopausal.  In the meantime, she was encouraged to increase her consumption of foods rich in calcium, as well as increase her weight-bearing activities.  She was given education on specific activities to promote bone health.  3. Cancer screening:  Due to Ms. Breeding's history and her age, she should receive screening for skin cancers, colon cancer, and gynecologic cancers.  We discussed age-appropriate guidelines.  4. Health maintenance and wellness promotion: Ms. Colasurdo was encouraged to consume 5-7 servings of fruits and vegetables per day. We reviewed the "Nutrition Rainbow" handout, as well as the handout "Take Control of Your Health and Reduce Your Cancer Risk" from the [employer].  She was also encouraged to engage in moderate to vigorous exercise for 30 minutes per day most days of the week. We discussed the [employer] fitness program, which is designed for cancer survivors to help them become more physically fit after cancer treatments.  She was instructed to limit her alcohol consumption and continue to abstain from tobacco use.   5. Support services/counseling: It is not uncommon  for this period of the patient's cancer care trajectory to be one of many emotions and stressors.  We discussed an opportunity for her to participate in the next session of [facility] ("Finding Your New Normal") support group series designed for patients  after they have completed treatment.   Ms. Vincelette was encouraged to take advantage of our many other support services programs, support groups, and/or counseling in coping with her new life as a cancer survivor after completing anti-cancer treatment.  She was offered support today through active listening and expressive supportive counseling.  She was given information regarding our available services and encouraged to contact me with any questions or for help enrolling in any of our support group/programs.    Dispo:   -Return to cancer center 11/2020 -Mammogram due in 12/2020 -Follow up with surgery 09/2020 as scheduled, then stagger visits  -F/up Dr. Marla Roe 11/2020  -R br [place] to r/o seroma, will call with results  -Referral to Ob/gyn for routine monitoring on tamoxifen -Begin tamoxifen, can divide dose 10 mg BID if needed  -She is welcome to return back to the Survivorship Clinic at any time; no additional follow-up needed at this time.  -Consider referral back to survivorship as a long-term survivor for continued surveillance   Orders Placed This Encounter  Procedures  . US BREAST COMPLETE UNI RIGHT INC AXILLA    Standing Status:   Future    Standing Expiration Date:   08/27/2021    Scheduling Instructions:     [facility]    Order Specific Question:   Reason for Exam (SYMPTOM  OR DIAGNOSIS REQUIRED)    Answer:   r/o post-op seroma    Order Specific Question:   Preferred imaging location?    Answer:   External  . Ambulatory referral to Obstetrics / Gynecology    Referral Priority:   Routine    Referral Type:   Consultation    Referral Reason:   Specialty Services Required    Requested Specialty:   Obstetrics and Gynecology    Number of Visits  Requested:   1   A total of (40) minutes of face-to-face time was spent with this patient with greater than 50% of that time in counseling and care-coordination.   Cira Rue, NP Survivorship Program [facility] [phone]   Note: PRIMARY CARE PROVIDER Harlan Stains, [place] [phone]

## 2020-08-29 ENCOUNTER — Other Ambulatory Visit: Payer: Self-pay

## 2020-08-29 ENCOUNTER — Telehealth: Payer: Self-pay | Admitting: Hematology

## 2020-08-29 DIAGNOSIS — D0511 Intraductal carcinoma in situ of right breast: Secondary | ICD-10-CM

## 2020-08-29 NOTE — Telephone Encounter (Signed)
Scheduled follow-up appointment per 4/11 los. Patient is aware.

## 2020-09-04 ENCOUNTER — Telehealth: Payer: Self-pay | Admitting: Radiation Oncology

## 2020-09-04 ENCOUNTER — Telehealth: Payer: Self-pay

## 2020-09-04 NOTE — Telephone Encounter (Signed)
I contacted Cecille Rubin with [facility] in order to follow up. Cecille Rubin requested the most recent office visit notes, patient demo sheet, and most recent labs that Dr. Burr Medico ordered on the patient. She states that when she received the referral electronically that no information was provided with the referral. I have faxed over the patient's most recent OV notes, labs, and demo sheet to [facility] with receipt of confirmation.

## 2020-09-04 NOTE — Telephone Encounter (Signed)
The patient had questions about laser hair removal given her prior radiation and breast surgery. I reached out to PT and they felt that the risk of lymphedema was low but not zero, and that there is not much research on this very topic, but it's still recommended to use an Copy. I also spoke with Dr. Lisbeth Renshaw and he agreed, the risk of lymphedema, or skin reaction in a recently radiated field isn't zero, but that he would recommend waiting more than 3 months prior to consider doing laser hair removal. When I spoke with the patient today by phone I suggested she wait to do this until she's further out given the time of year and risks of skin irritation from sun exposure and given the heat of the summer could make things more uncomfortable in a sweat prone area. She's in agreement and will call back if she has further questions or concerns.

## 2020-09-04 NOTE — Telephone Encounter (Signed)
-----   Message from Flo Shanks, RN sent at 09/04/2020 12:37 PM EDT ----- Regarding: Referral Hi Myriam Jacobson,   Please call Cecille Rubin at [phone] [phone] at [facility] regarding referral. She needs more info.  Thanks

## 2020-09-11 NOTE — Telephone Encounter (Signed)
Good to know, thanks! LB

## 2020-09-24 ENCOUNTER — Other Ambulatory Visit: Payer: Self-pay

## 2020-09-24 ENCOUNTER — Ambulatory Visit: Payer: BC Managed Care – PPO | Attending: General Surgery

## 2020-09-24 DIAGNOSIS — C50311 Malignant neoplasm of lower-inner quadrant of right female breast: Secondary | ICD-10-CM | POA: Insufficient documentation

## 2020-09-24 DIAGNOSIS — Z170 Estrogen receptor positive status [ER+]: Secondary | ICD-10-CM | POA: Insufficient documentation

## 2020-09-24 NOTE — Therapy (Signed)
[place], [place], [address] Phone: [phone]   Fax:  [phone]  Physical Therapy Treatment  Patient Details  Name: Ariel Andersen MRN: [id number] Date of Birth: [birth year] Referring Provider (PT): Dr. Autumn Messing   Encounter Date: 09/24/2020   PT End of Session - 09/24/20 0827    Visit Number 3   # unchanged due to screen only   Number of Visits 10    PT Start Time 0821    PT Stop Time 0828    PT Time Calculation (min) 7 min    Behavior During Therapy [facility] for tasks assessed/performed           Past Medical History:  Diagnosis Date  . Arthritis of knee, left   . Breast cancer ([place])   . Family history of pancreatic cancer 01/12/2020  . Hypertension   . Pre-diabetes    per patient  . Seasonal allergies     Past Surgical History:  Procedure Laterality Date  . BACK SURGERY  2017  . BREAST LUMPECTOMY WITH RADIOACTIVE SEED AND SENTINEL LYMPH NODE BIOPSY Right 03/07/2020   Procedure: RIGHT BREAST REDUCTION LUMPECTOMY WITH RADIOACTIVE SEED;  Surgeon: Jovita Kussmaul, MD;  Location: [facility];  Service: General;  Laterality: Right;  . BREAST REDUCTION SURGERY Bilateral 03/07/2020   Procedure: MAMMARY REDUCTION  (BREAST) ONCOPLASTIC REDUCTION;  Surgeon: Wallace Going, DO;  Location: [place];  Service: Plastics;  Laterality: Bilateral;  . SENTINEL NODE BIOPSY Right 03/07/2020   Procedure: RIGHT SENTINEL LYMPH NODE BIOPSY;  Surgeon: Jovita Kussmaul, MD;  Location: [facility];  Service: General;  Laterality: Right;    There were no vitals filed for this visit.   Subjective Assessment - 09/24/20 0824    Subjective Pt returns for 3 month L-Dex screen.    Pertinent History Patient was diagnosed on 12/05/2019 with right intermediate grade DCIS. She underwent a right lumpectomy and sentinel node biopsy on 03/07/2020 with 2 negative nodes removed and a bilateral breast reduction. It is ER/PR positive.                   L-DEX FLOWSHEETS - 09/24/20 0800      L-DEX LYMPHEDEMA SCREENING   Measurement Type Unilateral    L-DEX MEASUREMENT EXTREMITY Upper Extremity    POSITION  Standing    DOMINANT SIDE Right    At Risk Side Right    BASELINE SCORE (UNILATERAL) -5.9    L-DEX SCORE (UNILATERAL) -4.9    VALUE CHANGE (UNILAT) 1                                  PT Long Term Goals - 04/11/20 1147      PT LONG TERM GOAL #1   Title Patient will demonstrate she has regained full shoulder ROM and function post operatively compared to baselines.    Status Achieved      PT LONG TERM GOAL #2   Title Patient will increase right shoulder active flexion to be >/= 150 degrees for increased ease reaching overhead.    Baseline 128    Status Achieved      PT LONG TERM GOAL #3   Title Patient will increase right shoulder active abduction to be >/= 160 degrees for increased ease obtaining radiation positioning.    Baseline 135    Status Achieved      PT LONG TERM  GOAL #4   Title Patient will improve the DASH score to be </= 10 for improved overall upper extremity function.    Baseline 86.36    Status Deferred   forgot to asses, but pt states she has no problems                Plan - 09/24/20 3785    Clinical Impression Statement Pt returns for her 3 month L-Dex screens. Her chnage from baseline of 1 is WNLs so no furhter treatment is required at this time except to cont every 3 month L-Dex screens which pt is agreeable to.    PT Next Visit Plan Cont every 3 month L-Dex screens.    Consulted and Agree with Plan of Care Patient           Patient will benefit from skilled therapeutic intervention in order to improve the following deficits and impairments:     Visit Diagnosis: Malignant neoplasm of lower-inner quadrant of right breast of female, estrogen receptor positive ([place])     Problem List Patient Active Problem List   Diagnosis Date Noted  . Family  history of pancreatic cancer 01/12/2020  . Ductal carcinoma in situ (DCIS) of right breast 01/06/2020    Otelia Limes, PTA 09/24/2020, 8:35 AM  [place], [place], [address] Phone: [phone]   Fax:  [phone]  Name: Ariel Andersen MRN: [id number] Date of Birth: October 24, 1965

## 2020-10-24 ENCOUNTER — Other Ambulatory Visit: Payer: Self-pay

## 2020-10-25 MED ORDER — TAMOXIFEN CITRATE 20 MG PO TABS
20.0000 mg | ORAL_TABLET | Freq: Every day | ORAL | 1 refills | Status: DC
Start: 2020-10-25 — End: 2020-11-30

## 2020-11-30 ENCOUNTER — Inpatient Hospital Stay: Payer: BC Managed Care – PPO | Attending: Radiation Oncology | Admitting: Hematology

## 2020-11-30 ENCOUNTER — Other Ambulatory Visit: Payer: Self-pay

## 2020-11-30 ENCOUNTER — Encounter: Payer: Self-pay | Admitting: Hematology

## 2020-11-30 ENCOUNTER — Inpatient Hospital Stay: Payer: BC Managed Care – PPO

## 2020-11-30 VITALS — BP 155/100 | HR 87 | Temp 98.6°F | Resp 19 | Ht 64.0 in | Wt 186.9 lb

## 2020-11-30 DIAGNOSIS — Z170 Estrogen receptor positive status [ER+]: Secondary | ICD-10-CM | POA: Insufficient documentation

## 2020-11-30 DIAGNOSIS — I10 Essential (primary) hypertension: Secondary | ICD-10-CM | POA: Diagnosis not present

## 2020-11-30 DIAGNOSIS — E876 Hypokalemia: Secondary | ICD-10-CM | POA: Insufficient documentation

## 2020-11-30 DIAGNOSIS — R7303 Prediabetes: Secondary | ICD-10-CM | POA: Insufficient documentation

## 2020-11-30 DIAGNOSIS — G4730 Sleep apnea, unspecified: Secondary | ICD-10-CM | POA: Diagnosis not present

## 2020-11-30 DIAGNOSIS — M1990 Unspecified osteoarthritis, unspecified site: Secondary | ICD-10-CM | POA: Insufficient documentation

## 2020-11-30 DIAGNOSIS — D0511 Intraductal carcinoma in situ of right breast: Secondary | ICD-10-CM

## 2020-11-30 DIAGNOSIS — Z800 Family history of malignant neoplasm of digestive organs: Secondary | ICD-10-CM | POA: Diagnosis not present

## 2020-11-30 DIAGNOSIS — N951 Menopausal and female climacteric states: Secondary | ICD-10-CM | POA: Diagnosis not present

## 2020-11-30 DIAGNOSIS — Z79810 Long term (current) use of selective estrogen receptor modulators (SERMs): Secondary | ICD-10-CM | POA: Insufficient documentation

## 2020-11-30 LAB — CMP (CANCER CENTER ONLY)
ALT: 31 U/L (ref 0–44)
AST: 35 U/L (ref 15–41)
Albumin: 3.8 g/dL (ref 3.5–5.0)
Alkaline Phosphatase: 54 U/L (ref 38–126)
Anion gap: 10 (ref 5–15)
BUN: 8 mg/dL (ref 6–20)
CO2: 30 mmol/L (ref 22–32)
Calcium: 9.3 mg/dL (ref 8.9–10.3)
Chloride: 99 mmol/L (ref 98–111)
Creatinine: 0.84 mg/dL (ref 0.44–1.00)
GFR, Estimated: >60 mL/min (ref >60)
Glucose, Bld: 111 mg/dL — ABNORMAL HIGH (ref 70–99)
Potassium: 3 mmol/L — ABNORMAL LOW (ref 3.5–5.1)
Sodium: 139 mmol/L (ref 135–145)
Total Bilirubin: 0.7 mg/dL (ref 0.3–1.2)
Total Protein: 7.3 g/dL (ref 6.5–8.1)

## 2020-11-30 LAB — CBC WITH DIFFERENTIAL (CANCER CENTER ONLY)
Abs Immature Granulocytes: 0.01 10*3/uL (ref 0.00–0.07)
Basophils Absolute: 0.1 10*3/uL (ref 0.0–0.1)
Basophils Relative: 1 %
Eosinophils Absolute: 0.1 10*3/uL (ref 0.0–0.5)
Eosinophils Relative: 2 %
HCT: 40 % (ref 36.0–46.0)
Hemoglobin: 12.9 g/dL (ref 12.0–15.0)
Immature Granulocytes: 0 %
Lymphocytes Relative: 32 %
Lymphs Abs: 1.4 10*3/uL (ref 0.7–4.0)
MCH: 26.1 pg (ref 26.0–34.0)
MCHC: 32.3 g/dL (ref 30.0–36.0)
MCV: 81 fL (ref 80.0–100.0)
Monocytes Absolute: 0.4 10*3/uL (ref 0.1–1.0)
Monocytes Relative: 8 %
Neutro Abs: 2.4 10*3/uL (ref 1.7–7.7)
Neutrophils Relative %: 57 %
Platelet Count: 191 10*3/uL (ref 150–400)
RBC: 4.94 MIL/uL (ref 3.87–5.11)
RDW: 14.5 % (ref 11.5–15.5)
WBC Count: 4.2 10*3/uL (ref 4.0–10.5)
nRBC: 0 % (ref 0.0–0.2)

## 2020-11-30 MED ORDER — POTASSIUM CHLORIDE CRYS ER 20 MEQ PO TBCR: 20.0000 meq | EXTENDED_RELEASE_TABLET | Freq: Two times a day (BID) | ORAL | 0 refills | Status: DC

## 2020-11-30 MED ORDER — TAMOXIFEN CITRATE 20 MG PO TABS
20.0000 mg | ORAL_TABLET | Freq: Every day | ORAL | 5 refills | Status: DC
Start: 2020-11-30 — End: 2020-12-24

## 2020-11-30 NOTE — Progress Notes (Signed)
[place]   [phone] [phone]   Clinic Follow up Note   Patient Care Team: Harlan Stains, MD as PCP - General (Family Medicine) Rockwell Germany, RN as Oncology Nurse Navigator Mauro Kaufmann, RN as Oncology Nurse Navigator Truitt Merle, MD as Consulting Physician (Hematology) Jovita Kussmaul, MD as Consulting Physician (General Surgery) Kyung Rudd, MD as Consulting Physician (Radiation Oncology) Alla Feeling, NP as Nurse Practitioner (Nurse Practitioner)  Date of Service:  11/30/2020  CHIEF COMPLAINT: f/u of right breast DCIS  SUMMARY OF ONCOLOGIC HISTORY: Oncology History Overview Note  Cancer Staging Ductal carcinoma in situ (DCIS) of right breast Staging form: Breast, AJCC 8th Edition - Clinical stage from 01/11/2020: Stage 0 (cTis (DCIS), cN0, cM0, ER+, PR+) - Unsigned    Ductal carcinoma in situ (DCIS) of right breast  12/05/2019 Mammogram   Diagnostic Mammogram 12/05/19  IMPRESSION The 4cm span of segmental pleomorphic calcifications in the right breast lower inner aspect middle depth 7cm from the nipple are suspicious of malignancy.   01/04/2020 Initial Biopsy   Diagnosis  Breast, right, needle core biopsy, RLMQ, middle depth, 7cmfn -DUCTAL CARCINOMA IN SITU INVOLVING A PAPILLARY LESION -USUAL DUCTAL HYPERPLASIA AND COLUMNAR CELL CHANGE -SEE COMMENT   01/04/2020 Receptors her2   ER - 95% positive moderate staining  PR - 99% positive strong staining     01/06/2020 Initial Diagnosis   Ductal carcinoma in situ (DCIS) of right breast   03/07/2020 Surgery   RIGHT BREAST REDUCTION LUMPECTOMY WITH RADIOACTIVE SEED and RIGHT SENTINEL LYMPH NODE BIOPSY by Dr Marlou Starks   MAMMARY REDUCTION  (BREAST) ONCOPLASTIC REDUCTION by Dr Marla Roe    03/07/2020 Pathology Results   FINAL MICROSCOPIC DIAGNOSIS:   A. BREAST, RIGHT, LUMPECTOMY:  -  Ductal carcinoma in situ, low grade, 3.4 cm  -  Ductal carcinoma involves multiple papillary  lesions  -  Margins uninvolved by carcinoma (<0.1 cm; anterior margin)  -  Previous biopsy site changes  -  See oncology table and comment below   B. LYMPH NODE, RIGHT AXILLARY, SENTINEL, EXCISION:  -  No carcinoma identified in one lymph node (0/1)   C. LYMPH NODE, RIGHT AXILLARY, SENTINEL, EXCISION:  -  No carcinoma identified in one lymph node (0/1)   D. BREAST, LEFT, MAMMOPLASTY:  -  Usual ductal hyperplasia and fibrocystic changes with apocrine  metaplasia  -  Fibroadenomatoid nodule  -  No carcinoma identified  -  See comment   E. BREAST, RIGHT, MAMMOPLASTY:  -  Adenosis and fibrocystic changes with apocrine metaplasia  -  No carcinoma identified   F. BREAST, RIGHT ADDITIONAL INFERIOR MARGIN, EXCISION:  -  Atypical ductal hyperplasia involving intraductal papillomas  -  Lobular neoplasm (atypical lobular hyperplasia  -  See comment   COMMENT:   A.  The resection specimen has multiple papillary lesions, several of  which are involved by ductal carcinoma in situ.  Immunohistochemistry  (SMM, calponin, p63 and cytokeratin 5/6) was utilized to assess for an  invasive component and atypia at the inked resection margins.  Cytokeratin 5/6 is negative and foci of ductal carcinoma in situ.  Myoepithelial markers are retained supporting the absence of an invasive  component.   D.  Cytokeratin 5/6 is positive and/or has mosaic pattern expression and  foci of usual ductal hyperplasia.   E.  AMENDMENT NOTE: DIAGNOSTIC INFORMATION HAS NOT BEEN CHANGED. The  original report had a typographical error in part E.  The letter "F" was  inserted prior to the diagnosis.  This has been removed.   F. Cytokeratin 5/6 is negative and foci of atypical ductal hyperplasia.  Myoepithelial markers (SMM, p63 and calponin) are retained supporting  the absence of an invasive component.  E-cadherin is negative in the  foci of lobular neoplasia.    03/07/2020 Cancer Staging   Staging form:  Breast, AJCC 8th Edition - Pathologic stage from 03/07/2020: Stage 0 (pTis (DCIS), pN0, cM0, G1, ER+, PR+, HER2: Not Assessed) - Signed by Alla Feeling, NP on 08/27/2020  Histologic grading system: 3 grade system    04/16/2020 - 06/05/2020 Radiation Therapy   Adjuvant Radiation with Dr Lisbeth Renshaw    08/27/2020 -  Anti-estrogen oral therapy   Tamoxifen 20 mg po once daily   08/27/2020 Survivorship   SCP delivered by Cira Rue, NP      CURRENT THERAPY:  Tamoxifen, started 08/27/20  INTERVAL HISTORY:  Ariel Andersen is here for a follow up of DCIS. She was last seen by me on 05/31/20 and in the survivorship clinic on 08/27/20 in the interim. She presents to the clinic accompanied by her husband. Her BP was high today. She is unsure why, as her BP was normal when she checked it herself at home earlier today (111/82). She notes she checks it every day. She reports she is tolerating the tamoxifen relatively well. She reports hot flashes that are variable and interrupt her sleep. She denies joint pain.   All other systems were reviewed with the patient and are negative.  MEDICAL HISTORY:  Past Medical History:  Diagnosis Date   Arthritis of knee, left    Breast cancer ([place])    Family history of pancreatic cancer 01/12/2020   Hypertension    Pre-diabetes    per patient   Seasonal allergies     SURGICAL HISTORY: Past Surgical History:  Procedure Laterality Date   BACK SURGERY  2017   BREAST LUMPECTOMY WITH RADIOACTIVE SEED AND SENTINEL LYMPH NODE BIOPSY Right 03/07/2020   Procedure: RIGHT BREAST REDUCTION LUMPECTOMY WITH RADIOACTIVE SEED;  Surgeon: Jovita Kussmaul, MD;  Location: [facility];  Service: General;  Laterality: Right;   BREAST REDUCTION SURGERY Bilateral 03/07/2020   Procedure: MAMMARY REDUCTION  (BREAST) ONCOPLASTIC REDUCTION;  Surgeon: Wallace Going, DO;  Location: [facility];  Service: Plastics;  Laterality: Bilateral;   SENTINEL NODE BIOPSY Right 03/07/2020    Procedure: RIGHT SENTINEL LYMPH NODE BIOPSY;  Surgeon: Jovita Kussmaul, MD;  Location: [facility];  Service: General;  Laterality: Right;    I have reviewed the social history and family history with the patient and they are unchanged from previous note.  ALLERGIES:  has No Known Allergies.  MEDICATIONS:  Current Outpatient Medications  Medication Sig Dispense Refill   potassium chloride SA (KLOR-CON) 20 MEQ tablet Take 1 tablet (20 mEq total) by mouth 2 (two) times daily. 30 tablet 0   acetaminophen (TYLENOL) 500 MG tablet Take 1 tablet (500 mg total) by mouth every 6 (six) hours as needed. For use AFTER surgery 30 tablet 0   amLODipine (NORVASC) 10 MG tablet Take 1 tablet by mouth daily.     aspirin 81 MG EC tablet Take 81 mg by mouth daily.      atorvastatin (LIPITOR) 10 MG tablet Take 10 mg by mouth daily.     Biotin 10000 MCG TABS Take 10,000 mcg by mouth daily.      Blood Glucose Monitoring Suppl (CONTOUR NEXT ONE) KIT See admin instructions.  Cholecalciferol (VITAMIN D3 MAXIMUM STRENGTH PO) 1 capsule     CONTOUR NEXT TEST test strip daily.     ferrous sulfate 325 (65 FE) MG EC tablet Take 325 mg by mouth daily.      fluocinolone (SYNALAR) 0.01 % external solution Apply 1 application topically daily as needed (scalp irritation).     hydrochlorothiazide (HYDRODIURIL) 12.5 MG tablet 1 tablet in the morning     HYDROcodone-acetaminophen (NORCO/VICODIN) 5-325 MG tablet hydrocodone 5 mg-acetaminophen 325 mg tablet     ibuprofen (ADVIL) 600 MG tablet Take 1 tablet (600 mg total) by mouth every 6 (six) hours as needed for mild pain or moderate pain. For use AFTER surgery 30 tablet 0   metFORMIN (GLUCOPHAGE-XR) 500 MG 24 hr tablet SMARTSIG:1 Tablet(s) By Mouth Every Evening     Microlet Lancets MISC daily.     Multiple Vitamins-Minerals (MULTIVITAMIN WOMEN PO) Take 1 tablet by mouth daily.     tamoxifen (NOLVADEX) 20 MG tablet Take 1 tablet (20 mg total) by mouth daily. 30 tablet 5    triamcinolone cream (KENALOG) 0.1 % Apply 1 application topically daily as needed (eczema).      No current facility-administered medications for this visit.    PHYSICAL EXAMINATION: ECOG PERFORMANCE STATUS: 0 - Asymptomatic  Vitals:   11/30/20 1505  BP: (!) 155/100  Pulse: 87  Resp: 19  Temp: 98.6 F (37 C)  SpO2: 100%   Filed Weights   11/30/20 1505  Weight: 186 lb 14.4 oz (84.8 kg)    GENERAL:alert, no distress and comfortable SKIN: skin color, texture, turgor are normal, no rashes or significant lesions EYES: normal, Conjunctiva are pink and non-injected, sclera clear  NECK: supple, thyroid normal size, non-tender, without nodularity LYMPH:  no palpable lymphadenopathy in the cervical, axillary  LUNGS: clear to auscultation and percussion with normal breathing effort HEART: regular rate & rhythm and no murmurs and no lower extremity edema ABDOMEN:abdomen soft, non-tender and normal bowel sounds Musculoskeletal:no cyanosis of digits and no clubbing  NEURO: alert & oriented x 3 with fluent speech, no focal motor/sensory deficits BREAST: scar tissue palpable at incision site. No palpable mass, nodules or adenopathy bilaterally. Breast exam benign.   LABORATORY DATA:  I have reviewed the data as listed CBC Latest Ref Rng & Units 11/30/2020 03/01/2020 01/11/2020  WBC 4.0 - 10.5 K/uL 4.2 5.2 5.8  Hemoglobin 12.0 - 15.0 g/dL 12.9 12.7 11.6(L)  Hematocrit 36.0 - 46.0 % 40.0 42.7 38.0  Platelets 150 - 400 K/uL 191 214 206     CMP Latest Ref Rng & Units 11/30/2020 03/01/2020 01/11/2020  Glucose 70 - 99 mg/dL 111(H) 143(H) 185(H)  BUN 6 - 20 mg/dL _0 Creatinine 0.44 - 1.00 mg/dL 0.84 0.73 0.85  Sodium 135 - 145 mmol/L 139 140 138  Potassium 3.5 - 5.1 mmol/L 3.0(L) 3.2(L) 3.4(L)  Chloride 98 - 111 mmol/L 99 101 102  CO2 22 - 32 mmol/L _1 Calcium 8.9 - 10.3 mg/dL 9.3 9.5 9.7  Total Protein 6.5 - 8.1 g/dL 7.3 - 6.8  Total Bilirubin 0.3 - 1.2 mg/dL 0.7 - 0.5   Alkaline Phos 38 - 126 U/L 54 - 61  AST 15 - 41 U/L 35 - 22  ALT 0 - 44 U/L 31 - 25      RADIOGRAPHIC STUDIES: I have personally reviewed the radiological images as listed and agreed with the findings in the report. No results found.   ASSESSMENT & PLAN:  Ariel Andersen is a 55 y.o. female with   1. Right breast DCIS, Intermediate grade II, ER+/PR+, ADH, LDH  -She was diagnosed in 12/2019 with 4cm DCIS in right breast. -She underwent right breast lumpectomy and SLNB by Dr Marlou Starks on 03/07/20. I -Although she has low-grade DCIS, she does not have other high risk features including ADH and LDH, which will predict high risk of breast cancer in the future. -She proceeded with adjuvant Radiation with Dr Lisbeth Renshaw from 04/16/20 to 06/05/20.  -She discussed tamoxifen again with NP Lacie at her survivorship visit on 08/27/20. She agreed to try the medication. She is tolerating relatively well with hot flashes. I refilled for her today (11/30/20). -She is doing clinically well. Labs reviewed, CBC and CMP WNL, except K at 3.0. (This is discussed below.) -She will be due for annual mammography later this month. -She will f/u in 4 months  2. Hot Flashes -secondary to tamoxifen. -I discussed possibly adding medication (SSRI or gabapentin) to relieve the hot flashes if she feels they are intolerable. We reviewed benefit and side effects. At this time, she feels she can manage and would like to hold on adding medication.  3. Hypokalemia -potassium has been trending low, down to 3.0 today (11/30/20). This is probably related to her HCTZ. She is unsure if or when her PCP checks her blood work. -I recommend starting a potassium supplement. I prescribed for her today.   4. Comorbidities: HTN, Pre-diabetic, arthritis, Sleep Apnea -She is on medication for HTN and has not required medication for diabetes. She monitors her BP and sugar daily. Her BP is normal at home (111/82) and her sugar under control (at  most 130's at home, in evening after eating), per 11/30/20 -She has chronic arthritis. She has undergone lumbar spine surgery around 2017 and back pain now occasional. She planned to do left knee surgery in 01/2020 but this has been postponed given recent breast cancer diagnosis.     PLAN:  -I refilled her tamoxifen, continue 20 mg daily -I prescribed #30 potassium supplement, she will take 1 tab daily for a week then every other day until finish. I encouraged her to take K rich food  -proceed with mammogram this month -labs and f/u in 4 months    No problem-specific Assessment & Plan notes found for this encounter.   No orders of the defined types were placed in this encounter.  All questions were answered. The patient knows to call the clinic with any problems, questions or concerns. No barriers to learning was detected. The total time spent in the appointment was 30 minutes.     Truitt Merle, MD 11/30/2020   I, Wilburn Mylar, am acting as scribe for Truitt Merle, MD.   I have reviewed the above documentation for accuracy and completeness, and I agree with the above.

## 2020-12-07 ENCOUNTER — Ambulatory Visit (INDEPENDENT_AMBULATORY_CARE_PROVIDER_SITE_OTHER): Payer: BC Managed Care – PPO | Admitting: Plastic Surgery

## 2020-12-07 ENCOUNTER — Other Ambulatory Visit: Payer: Self-pay

## 2020-12-07 ENCOUNTER — Encounter: Payer: Self-pay | Admitting: Plastic Surgery

## 2020-12-07 DIAGNOSIS — Z853 Personal history of malignant neoplasm of breast: Secondary | ICD-10-CM | POA: Insufficient documentation

## 2020-12-07 DIAGNOSIS — N651 Disproportion of reconstructed breast: Secondary | ICD-10-CM | POA: Insufficient documentation

## 2020-12-07 NOTE — Progress Notes (Signed)
   Subjective:    Patient ID: Ariel Andersen, female    DOB: 1965/09/20, 55 y.o.   MRN: [id number]  The patient is a 55 year old female here with her husband for follow-up after undergoing bilateral oncoplastic breast reductions for a right-sided breast cancer.  She had ductal carcinoma in situ of the right breast.  She is 5 feet 4 inches tall.  Her preoperative bra was a 44 DD.  She had the oncoplastic reduction and had 174 g removed from the right breast after the partial mastectomy and 647 g removed from the left breast.  She finished her radiation over 6 months ago.  She has noticed that she still has firmness in the right breast from the radiation.  She still has skin color changes with hyperpigmentation.  Overall though she has done really well and has retained good symmetry.  There are no concerning areas.  The firmness part of the right breast is the medial aspect.     Review of Systems  Constitutional: Negative.   HENT: Negative.    Eyes: Negative.   Respiratory: Negative.    Cardiovascular: Negative.   Gastrointestinal: Negative.   Endocrine: Negative.   Genitourinary: Negative.   Hematological: Negative.   Psychiatric/Behavioral: Negative.        Objective:   Physical Exam Vitals and nursing note reviewed.  Constitutional:      Appearance: Normal appearance.  HENT:     Head: Normocephalic and atraumatic.  Cardiovascular:     Rate and Rhythm: Normal rate.     Pulses: Normal pulses.  Pulmonary:     Effort: Pulmonary effort is normal.  Abdominal:     General: Abdomen is flat. There is no distension.  Skin:    General: Skin is warm.     Capillary Refill: Capillary refill takes less than 2 seconds.  Neurological:     General: No focal deficit present.     Mental Status: She is alert and oriented to person, place, and time.  Psychiatric:        Mood and Affect: Mood normal.        Behavior: Behavior normal.        Assessment & Plan:     ICD-10-CM   1.  Breast asymmetry following reconstructive surgery  N65.1     2. History of breast cancer  Z85.3       Recommend continued massage and giving it more time.  She does have some options for improvement with liposuction and possible fat grafting or release scar tissue.  I would definitely wait until she is a year or more out from her radiation to be safe from aggravating the radiation damage.  The patient is in agreement.  Pictures were obtained of the patient and placed in the chart with the patient's or guardian's permission.

## 2020-12-21 ENCOUNTER — Telehealth: Payer: Self-pay

## 2020-12-24 ENCOUNTER — Other Ambulatory Visit: Payer: Self-pay

## 2020-12-24 ENCOUNTER — Encounter: Payer: Self-pay | Admitting: Hematology

## 2020-12-24 ENCOUNTER — Telehealth: Payer: Self-pay | Admitting: Nurse Practitioner

## 2020-12-24 ENCOUNTER — Ambulatory Visit: Payer: BC Managed Care – PPO | Attending: General Surgery

## 2020-12-24 VITALS — Wt 186.1 lb

## 2020-12-24 DIAGNOSIS — Z483 Aftercare following surgery for neoplasm: Secondary | ICD-10-CM | POA: Insufficient documentation

## 2020-12-24 MED ORDER — TAMOXIFEN CITRATE 10 MG PO TABS: 10.0000 mg | ORAL_TABLET | Freq: Two times a day (BID) | ORAL | 3 refills | Status: DC

## 2020-12-24 NOTE — Therapy (Signed)
[facility] [place], [place], [address] Phone: [phone]   Fax:  [phone]  Physical Therapy Treatment  Patient Details  Name: Ariel Andersen MRN: [id number] Date of Birth: 1965/09/22 Referring Provider (PT): Dr. Autumn Messing   Encounter Date: 12/24/2020   PT End of Session - 12/24/20 1700     Visit Number 3   # unchanged due to screen only   PT Start Time 1656    PT Stop Time 1701    PT Time Calculation (min) 5 min    Activity Tolerance Patient tolerated treatment well    Behavior During Therapy [facility] for tasks assessed/performed             Past Medical History:  Diagnosis Date   Arthritis of knee, left    Breast cancer ([place])    Family history of pancreatic cancer 01/12/2020   Hypertension    Pre-diabetes    per patient   Seasonal allergies     Past Surgical History:  Procedure Laterality Date   BACK SURGERY  2017   BREAST LUMPECTOMY WITH RADIOACTIVE SEED AND SENTINEL LYMPH NODE BIOPSY Right 03/07/2020   Procedure: RIGHT BREAST REDUCTION LUMPECTOMY WITH RADIOACTIVE SEED;  Surgeon: Jovita Kussmaul, MD;  Location: [facility];  Service: General;  Laterality: Right;   BREAST REDUCTION SURGERY Bilateral 03/07/2020   Procedure: MAMMARY REDUCTION  (BREAST) ONCOPLASTIC REDUCTION;  Surgeon: Wallace Going, DO;  Location: [facility];  Service: Plastics;  Laterality: Bilateral;   SENTINEL NODE BIOPSY Right 03/07/2020   Procedure: RIGHT SENTINEL LYMPH NODE BIOPSY;  Surgeon: Jovita Kussmaul, MD;  Location: [facility];  Service: General;  Laterality: Right;    Vitals:   12/24/20 1657  Weight: 186 lb 2 oz (84.4 kg)     Subjective Assessment - 12/24/20 1658     Subjective Pt returns for 3 month L-Dex screen.    Pertinent History Patient was diagnosed on 12/05/2019 with right intermediate grade DCIS. She underwent a right lumpectomy and sentinel node biopsy on 03/07/2020 with 2 negative nodes removed and a bilateral  breast reduction. It is ER/PR positive.                    L-DEX FLOWSHEETS - 12/24/20 1600       L-DEX LYMPHEDEMA SCREENING   Measurement Type Unilateral    L-DEX MEASUREMENT EXTREMITY Upper Extremity    POSITION  Standing    DOMINANT SIDE Right    At Risk Side Right    BASELINE SCORE (UNILATERAL) -5.9    L-DEX SCORE (UNILATERAL) -4.9    VALUE CHANGE (UNILAT) 1                                    PT Long Term Goals - 04/11/20 1147       PT LONG TERM GOAL #1   Title Patient will demonstrate she has regained full shoulder ROM and function post operatively compared to baselines.    Status Achieved      PT LONG TERM GOAL #2   Title Patient will increase right shoulder active flexion to be >/= 150 degrees for increased ease reaching overhead.    Baseline 128    Status Achieved      PT LONG TERM GOAL #3   Title Patient will increase right shoulder active abduction to be >/= 160 degrees for increased ease obtaining radiation  positioning.    Baseline 135    Status Achieved      PT LONG TERM GOAL #4   Title Patient will improve the DASH score to be </= 10 for improved overall upper extremity function.    Baseline 86.36    Status Deferred   forgot to asses, but pt states she has no problems                  Plan - 12/24/20 1701     Clinical Impression Statement Pt returns for her 3 month L-Dex screen. Her change from baseline of 1 is WNLs so no further treament is required at this time except to cont every 3 month L-Dex screens which pt is agreeable to.    PT Next Visit Plan Cont every 3 month L-Dex screens for up to 2 years from her SLNB.    Consulted and Agree with Plan of Care Patient             Patient will benefit from skilled therapeutic intervention in order to improve the following deficits and impairments:     Visit Diagnosis: Aftercare following surgery for neoplasm     Problem List Patient Active Problem  List   Diagnosis Date Noted   Breast asymmetry following reconstructive surgery 12/07/2020   History of breast cancer 12/07/2020   Family history of pancreatic cancer 01/12/2020   Ductal carcinoma in situ (DCIS) of right breast 01/06/2020    Otelia Limes, PTA 12/24/2020, 5:08 PM  [facility] [place], [place], [address] Phone: [phone]   Fax:  [phone]  Name: Ariel Andersen MRN: [id number] Date of Birth: 1965-10-27

## 2020-12-24 NOTE — Telephone Encounter (Signed)
I called Ms. Volpe in response to her message about worsening hot flashes, interfering with rest. She is asking for alternative to tamoxifen. Given that she is perimenopausal, this is the only anti-estrogen we can use at this time. I recommend to try 10 mg BID rather than 20 mg once daily. We discussed adding SSRI or gabapentin, she prefers to wait. She will let us know how things go on tamoxifen 10 mg BID. New Rx sent.   Cira Rue, NP

## 2020-12-28 NOTE — Telephone Encounter (Signed)
This nurse received a call from this patient wanting to speak with the provider about taking her off of the Tamoxifen.  Patients states that she has been having increased hot flashes.  This nurse forwarded information to the provider.

## 2021-01-28 ENCOUNTER — Telehealth: Payer: Self-pay

## 2021-01-28 NOTE — Telephone Encounter (Signed)
This nurse returned call to patient related to orders from [facility].  This nurse contacted [facility] and requested the orders be faxed again so the provider can sign and submit to [facility]. Patient has been made aware.   No further questions or concerns at this time.

## 2021-01-29 ENCOUNTER — Telehealth: Payer: Self-pay

## 2021-01-29 NOTE — Telephone Encounter (Signed)
This nurse reached out to patient and made aware that orders for diagnostic mammogram was signed and faxed back to [facility] on 01/28/21.  Reminded patient of her scheduled appointment for 01/30/21.  No further questions or concerns at this time.

## 2021-04-05 ENCOUNTER — Inpatient Hospital Stay: Payer: BC Managed Care – PPO

## 2021-04-05 ENCOUNTER — Encounter: Payer: Self-pay | Admitting: Hematology

## 2021-04-05 ENCOUNTER — Inpatient Hospital Stay: Payer: BC Managed Care – PPO | Attending: Hematology | Admitting: Hematology

## 2021-04-05 ENCOUNTER — Other Ambulatory Visit: Payer: Self-pay

## 2021-04-05 VITALS — BP 134/88 | HR 82 | Temp 98.8°F | Resp 19 | Ht 64.0 in | Wt 188.3 lb

## 2021-04-05 DIAGNOSIS — D0511 Intraductal carcinoma in situ of right breast: Secondary | ICD-10-CM | POA: Insufficient documentation

## 2021-04-05 DIAGNOSIS — I10 Essential (primary) hypertension: Secondary | ICD-10-CM | POA: Diagnosis not present

## 2021-04-05 DIAGNOSIS — Z800 Family history of malignant neoplasm of digestive organs: Secondary | ICD-10-CM | POA: Diagnosis not present

## 2021-04-05 DIAGNOSIS — Z79810 Long term (current) use of selective estrogen receptor modulators (SERMs): Secondary | ICD-10-CM | POA: Insufficient documentation

## 2021-04-05 DIAGNOSIS — R7303 Prediabetes: Secondary | ICD-10-CM | POA: Insufficient documentation

## 2021-04-05 DIAGNOSIS — Z170 Estrogen receptor positive status [ER+]: Secondary | ICD-10-CM | POA: Diagnosis not present

## 2021-04-05 DIAGNOSIS — E876 Hypokalemia: Secondary | ICD-10-CM | POA: Insufficient documentation

## 2021-04-05 DIAGNOSIS — G4730 Sleep apnea, unspecified: Secondary | ICD-10-CM | POA: Diagnosis not present

## 2021-04-05 DIAGNOSIS — M1990 Unspecified osteoarthritis, unspecified site: Secondary | ICD-10-CM | POA: Insufficient documentation

## 2021-04-05 DIAGNOSIS — R232 Flushing: Secondary | ICD-10-CM | POA: Diagnosis not present

## 2021-04-05 DIAGNOSIS — N951 Menopausal and female climacteric states: Secondary | ICD-10-CM | POA: Insufficient documentation

## 2021-04-05 LAB — CBC WITH DIFFERENTIAL (CANCER CENTER ONLY)
Abs Immature Granulocytes: 0.01 10*3/uL (ref 0.00–0.07)
Basophils Absolute: 0 10*3/uL (ref 0.0–0.1)
Basophils Relative: 1 %
Eosinophils Absolute: 0.1 10*3/uL (ref 0.0–0.5)
Eosinophils Relative: 2 %
HCT: 37.7 % (ref 36.0–46.0)
Hemoglobin: 11.9 g/dL — ABNORMAL LOW (ref 12.0–15.0)
Immature Granulocytes: 0 %
Lymphocytes Relative: 31 %
Lymphs Abs: 1.3 10*3/uL (ref 0.7–4.0)
MCH: 25.4 pg — ABNORMAL LOW (ref 26.0–34.0)
MCHC: 31.6 g/dL (ref 30.0–36.0)
MCV: 80.6 fL (ref 80.0–100.0)
Monocytes Absolute: 0.3 10*3/uL (ref 0.1–1.0)
Monocytes Relative: 8 %
Neutro Abs: 2.4 10*3/uL (ref 1.7–7.7)
Neutrophils Relative %: 58 %
Platelet Count: 200 10*3/uL (ref 150–400)
RBC: 4.68 MIL/uL (ref 3.87–5.11)
RDW: 14 % (ref 11.5–15.5)
WBC Count: 4.1 10*3/uL (ref 4.0–10.5)
nRBC: 0 % (ref 0.0–0.2)

## 2021-04-05 LAB — CMP (CANCER CENTER ONLY)
ALT: 31 U/L (ref 0–44)
AST: 33 U/L (ref 15–41)
Albumin: 3.7 g/dL (ref 3.5–5.0)
Alkaline Phosphatase: 55 U/L (ref 38–126)
Anion gap: 11 (ref 5–15)
BUN: 12 mg/dL (ref 6–20)
CO2: 29 mmol/L (ref 22–32)
Calcium: 9.4 mg/dL (ref 8.9–10.3)
Chloride: 100 mmol/L (ref 98–111)
Creatinine: 0.82 mg/dL (ref 0.44–1.00)
GFR, Estimated: >60 mL/min (ref >60)
Glucose, Bld: 188 mg/dL — ABNORMAL HIGH (ref 70–99)
Potassium: 2.9 mmol/L — ABNORMAL LOW (ref 3.5–5.1)
Sodium: 140 mmol/L (ref 135–145)
Total Bilirubin: 0.5 mg/dL (ref 0.3–1.2)
Total Protein: 6.9 g/dL (ref 6.5–8.1)

## 2021-04-05 MED ORDER — POTASSIUM CHLORIDE CRYS ER 20 MEQ PO TBCR: 20.0000 meq | EXTENDED_RELEASE_TABLET | Freq: Two times a day (BID) | ORAL | 0 refills | Status: DC

## 2021-04-05 NOTE — Progress Notes (Signed)
[place]   [phone] [phone]   Clinic Follow up Note   Patient Care Team: Harlan Stains, MD as PCP - General (Family Medicine) Rockwell Germany, RN as Oncology Nurse Navigator Mauro Kaufmann, RN as Oncology Nurse Navigator Truitt Merle, MD as Consulting Physician (Hematology) Jovita Kussmaul, MD as Consulting Physician (General Surgery) Kyung Rudd, MD as Consulting Physician (Radiation Oncology) Alla Feeling, NP as Nurse Practitioner (Nurse Practitioner)  Date of Service:  04/05/2021  CHIEF COMPLAINT: f/u of right breast DCIS  CURRENT THERAPY:  Tamoxifen, started 08/27/20, changed to 10 mg BID 12/24/20 d/t worsening hot flashes, decreased to 10 mg once a day 04/05/21  ASSESSMENT & PLAN:  Ariel Andersen is a 55 y.o. female with   1. Right breast DCIS, Intermediate grade II, ER+/PR+, ADH, LDH  -She was diagnosed in 12/2019 with 4cm DCIS in right breast. -She underwent right breast lumpectomy and SLNB by Dr Marlou Starks on 03/07/20.  -Although she has low-grade DCIS, she does not have other high risk features including ADH and LDH, which will predict high risk of breast cancer in the future. -She proceeded with adjuvant Radiation with Dr Lisbeth Renshaw from 04/16/20 to 06/05/20.  -she began tamoxifen 08/27/20. She is experiencing worsening hot flashes. She changed her dosing to 10 mg BID but is still having hot flashes. We will try to go down to just once daily. I discussed that tamoxifen is preventative, so we can stop if she cannot tolerate. -most recent MM 01/30/[facility] was negative, per pt. -She is doing clinically well. Labs reviewed, CBC and CMP WNL, except K at 2.9. Physical exam showed stable hard tissue s/p radiation. There is no clinical concern for recurrence. -I discussed screening breast MRI with her today. I reviewed the benefits and dilemmas of this. I will order one for her and see if we can get it approved with her insurance. This would be  due 06/2021. -She will f/u in 4 months   2. Hot Flashes -secondary to tamoxifen. -I reviewed possibly adding medication (SSRI or gabapentin) to relieve the hot flashes if she feels they are intolerable. She again declines as she would not like to add another medication.   3. Hypokalemia -potassium has been trending low. This is probably related to her HCTZ.  -I previously prescribed supplement, which she took until she ran out. I recommend she continue given she is on HCTZ. I will refill for her today.   4. Comorbidities: HTN, Pre-diabetic, arthritis, Sleep Apnea -She is on medication for HTN and has not required medication for diabetes. She monitors her BP and sugar daily.  -She has chronic arthritis. She has undergone lumbar spine surgery around 2017 and back pain now occasional. She planned to do left knee surgery in 01/2020 but this has been postponed given recent breast cancer diagnosis.     PLAN:  -decrease tamoxifen to 10 mg once a day -I refilled her KCL -I ordered screening MRI to be done 06/2021 -phone visit in 3 months -lab and f/u in 6 months   No problem-specific Assessment & Plan notes found for this encounter.   SUMMARY OF ONCOLOGIC HISTORY: Oncology History Overview Note  Cancer Staging Ductal carcinoma in situ (DCIS) of right breast Staging form: Breast, AJCC 8th Edition - Clinical stage from 01/11/2020: Stage 0 (cTis (DCIS), cN0, cM0, ER+, PR+) - Unsigned    Ductal carcinoma in situ (DCIS) of right breast  12/05/2019 Mammogram   Diagnostic Mammogram  12/05/19  IMPRESSION The 4cm span of segmental pleomorphic calcifications in the right breast lower inner aspect middle depth 7cm from the nipple are suspicious of malignancy.   01/04/2020 Initial Biopsy   Diagnosis  Breast, right, needle core biopsy, RLMQ, middle depth, 7cmfn -DUCTAL CARCINOMA IN SITU INVOLVING A PAPILLARY LESION -USUAL DUCTAL HYPERPLASIA AND COLUMNAR CELL CHANGE -SEE COMMENT   01/04/2020  Receptors her2   ER - 95% positive moderate staining  PR - 99% positive strong staining     01/06/2020 Initial Diagnosis   Ductal carcinoma in situ (DCIS) of right breast   03/07/2020 Surgery   RIGHT BREAST REDUCTION LUMPECTOMY WITH RADIOACTIVE SEED and RIGHT SENTINEL LYMPH NODE BIOPSY by Dr Marlou Starks   MAMMARY REDUCTION  (BREAST) ONCOPLASTIC REDUCTION by Dr Marla Roe    03/07/2020 Pathology Results   FINAL MICROSCOPIC DIAGNOSIS:   A. BREAST, RIGHT, LUMPECTOMY:  -  Ductal carcinoma in situ, low grade, 3.4 cm  -  Ductal carcinoma involves multiple papillary lesions  -  Margins uninvolved by carcinoma (<0.1 cm; anterior margin)  -  Previous biopsy site changes  -  See oncology table and comment below   B. LYMPH NODE, RIGHT AXILLARY, SENTINEL, EXCISION:  -  No carcinoma identified in one lymph node (0/1)   C. LYMPH NODE, RIGHT AXILLARY, SENTINEL, EXCISION:  -  No carcinoma identified in one lymph node (0/1)   D. BREAST, LEFT, MAMMOPLASTY:  -  Usual ductal hyperplasia and fibrocystic changes with apocrine  metaplasia  -  Fibroadenomatoid nodule  -  No carcinoma identified  -  See comment   E. BREAST, RIGHT, MAMMOPLASTY:  -  Adenosis and fibrocystic changes with apocrine metaplasia  -  No carcinoma identified   F. BREAST, RIGHT ADDITIONAL INFERIOR MARGIN, EXCISION:  -  Atypical ductal hyperplasia involving intraductal papillomas  -  Lobular neoplasm (atypical lobular hyperplasia  -  See comment   COMMENT:   A.  The resection specimen has multiple papillary lesions, several of  which are involved by ductal carcinoma in situ.  Immunohistochemistry  (SMM, calponin, p63 and cytokeratin 5/6) was utilized to assess for an  invasive component and atypia at the inked resection margins.  Cytokeratin 5/6 is negative and foci of ductal carcinoma in situ.  Myoepithelial markers are retained supporting the absence of an invasive  component.   D.  Cytokeratin 5/6 is positive and/or has  mosaic pattern expression and  foci of usual ductal hyperplasia.   E.  AMENDMENT NOTE: DIAGNOSTIC INFORMATION HAS NOT BEEN CHANGED. The  original report had a typographical error in part E.  The letter "F" was  inserted prior to the diagnosis.  This has been removed.   F. Cytokeratin 5/6 is negative and foci of atypical ductal hyperplasia.  Myoepithelial markers (SMM, p63 and calponin) are retained supporting  the absence of an invasive component.  E-cadherin is negative in the  foci of lobular neoplasia.    03/07/2020 Cancer Staging   Staging form: Breast, AJCC 8th Edition - Pathologic stage from 03/07/2020: Stage 0 (pTis (DCIS), pN0, cM0, G1, ER+, PR+, HER2: Not Assessed) - Signed by Alla Feeling, NP on 08/27/2020 Histologic grading system: 3 grade system    04/16/2020 - 06/05/2020 Radiation Therapy   Adjuvant Radiation with Dr Lisbeth Renshaw    08/27/2020 -  Anti-estrogen oral therapy   Tamoxifen 20 mg po once daily   08/27/2020 Survivorship   SCP delivered by Cira Rue, NP      INTERVAL HISTORY:  Logan Bores  M Hepworth is here for a follow up of DCIS. She was last seen by me on 11/30/20. She presents to the clinic alone. She reports only some relief from changing the tamoxifen to 10 mg BID. She reports continued hot flashes, especially at night, which has been challenging to her.    All other systems were reviewed with the patient and are negative.  MEDICAL HISTORY:  Past Medical History:  Diagnosis Date   Arthritis of knee, left    Breast cancer ([place])    Family history of pancreatic cancer 01/12/2020   Hypertension    Pre-diabetes    per patient   Seasonal allergies     SURGICAL HISTORY: Past Surgical History:  Procedure Laterality Date   BACK SURGERY  2017   BREAST LUMPECTOMY WITH RADIOACTIVE SEED AND SENTINEL LYMPH NODE BIOPSY Right 03/07/2020   Procedure: RIGHT BREAST REDUCTION LUMPECTOMY WITH RADIOACTIVE SEED;  Surgeon: Jovita Kussmaul, MD;  Location: [facility];  Service:  General;  Laterality: Right;   BREAST REDUCTION SURGERY Bilateral 03/07/2020   Procedure: MAMMARY REDUCTION  (BREAST) ONCOPLASTIC REDUCTION;  Surgeon: Wallace Going, DO;  Location: [facility];  Service: Plastics;  Laterality: Bilateral;   SENTINEL NODE BIOPSY Right 03/07/2020   Procedure: RIGHT SENTINEL LYMPH NODE BIOPSY;  Surgeon: Jovita Kussmaul, MD;  Location: [facility];  Service: General;  Laterality: Right;    I have reviewed the social history and family history with the patient and they are unchanged from previous note.  ALLERGIES:  has No Known Allergies.  MEDICATIONS:  Current Outpatient Medications  Medication Sig Dispense Refill   acetaminophen (TYLENOL) 500 MG tablet Take 1 tablet (500 mg total) by mouth every 6 (six) hours as needed. For use AFTER surgery 30 tablet 0   amLODipine (NORVASC) 5 MG tablet Take 5 mg by mouth daily.     aspirin 81 MG EC tablet Take 81 mg by mouth daily.      atorvastatin (LIPITOR) 10 MG tablet Take 10 mg by mouth daily.     Biotin 10000 MCG TABS Take 10,000 mcg by mouth daily.      Blood Glucose Monitoring Suppl (CONTOUR NEXT ONE) KIT See admin instructions.     Cholecalciferol (VITAMIN D3 MAXIMUM STRENGTH PO) 1 capsule     CONTOUR NEXT TEST test strip daily.     ferrous sulfate 325 (65 FE) MG EC tablet Take 325 mg by mouth daily.      fluocinolone (SYNALAR) 0.01 % external solution Apply 1 application topically daily as needed (scalp irritation).     hydrochlorothiazide (HYDRODIURIL) 25 MG tablet Take 25 mg by mouth every morning.     HYDROcodone-acetaminophen (NORCO/VICODIN) 5-325 MG tablet hydrocodone 5 mg-acetaminophen 325 mg tablet     ibuprofen (ADVIL) 600 MG tablet Take 1 tablet (600 mg total) by mouth every 6 (six) hours as needed for mild pain or moderate pain. For use AFTER surgery 30 tablet 0   metFORMIN (GLUCOPHAGE-XR) 500 MG 24 hr tablet SMARTSIG:1 Tablet(s) By Mouth Every Evening     Microlet Lancets MISC daily.     Multiple  Vitamins-Minerals (MULTIVITAMIN WOMEN PO) Take 1 tablet by mouth daily.     potassium chloride SA (KLOR-CON) 20 MEQ tablet Take 1 tablet (20 mEq total) by mouth 2 (two) times daily. 40 tablet 0   tamoxifen (NOLVADEX) 10 MG tablet Take 1 tablet (10 mg total) by mouth 2 (two) times daily. 60 tablet 3   triamcinolone cream (KENALOG) 0.1 % Apply 1  application topically daily as needed (eczema).      No current facility-administered medications for this visit.    PHYSICAL EXAMINATION: ECOG PERFORMANCE STATUS: 0 - Asymptomatic  Vitals:   04/05/21 1525  BP: 134/88  Pulse: 82  Resp: 19  Temp: 98.8 F (37.1 C)  SpO2: 97%   Wt Readings from Last 3 Encounters:  04/05/21 188 lb 4.8 oz (85.4 kg)  12/24/20 186 lb 2 oz (84.4 kg)  11/30/20 186 lb 14.4 oz (84.8 kg)     GENERAL:alert, no distress and comfortable SKIN: skin color, texture, turgor are normal, no rashes or significant lesions EYES: normal, Conjunctiva are pink and non-injected, sclera clear  NECK: supple, thyroid normal size, non-tender, without nodularity LYMPH:  no palpable lymphadenopathy in the cervical, axillary  LUNGS: clear to auscultation and percussion with normal breathing effort HEART: regular rate & rhythm and no murmurs and no lower extremity edema ABDOMEN:abdomen soft, non-tender and normal bowel sounds Musculoskeletal:no cyanosis of digits and no clubbing  NEURO: alert & oriented x 3 with fluent speech, no focal motor/sensory deficits BREAST: stable hard tissue in right breast. No palpable mass, nodules or adenopathy bilaterally. Breast exam benign.   LABORATORY DATA:  I have reviewed the data as listed CBC Latest Ref Rng & Units 04/05/2021 11/30/2020 03/01/2020  WBC 4.0 - 10.5 K/uL 4.1 4.2 5.2  Hemoglobin 12.0 - 15.0 g/dL 11.9(L) 12.9 12.7  Hematocrit 36.0 - 46.0 % 37.7 40.0 42.7  Platelets 150 - 400 K/uL 200 191 214     CMP Latest Ref Rng & Units 04/05/2021 11/30/2020 03/01/2020  Glucose 70 - 99 mg/dL  188(H) 111(H) 143(H)  BUN 6 - 20 mg/dL '12 8 7  ' Creatinine 0.44 - 1.00 mg/dL 0.82 0.84 0.73  Sodium 135 - 145 mmol/L 140 139 140  Potassium 3.5 - 5.1 mmol/L 2.9(L) 3.0(L) 3.2(L)  Chloride 98 - 111 mmol/L 100 99 101  CO2 22 - 32 mmol/L '29 30 31  ' Calcium 8.9 - 10.3 mg/dL 9.4 9.3 9.5  Total Protein 6.5 - 8.1 g/dL 6.9 7.3 -  Total Bilirubin 0.3 - 1.2 mg/dL 0.5 0.7 -  Alkaline Phos 38 - 126 U/L 55 54 -  AST 15 - 41 U/L 33 35 -  ALT 0 - 44 U/L 31 31 -      RADIOGRAPHIC STUDIES: I have personally reviewed the radiological images as listed and agreed with the findings in the report. No results found.    Orders Placed This Encounter  Procedures   MR BREAST BILATERAL W WO CONTRAST INC CAD    Standing Status:   Future    Standing Expiration Date:   04/05/2022    Order Specific Question:   If indicated for the ordered procedure, I authorize the administration of contrast media per Radiology protocol    Answer:   Yes    Order Specific Question:   What is the patient's sedation requirement?    Answer:   No Sedation    Order Specific Question:   Does the patient have a pacemaker or implanted devices?    Answer:   No    Order Specific Question:   Radiology Contrast Protocol - do NOT remove file path    Answer:   [email or web address]    Order Specific Question:   Preferred imaging location?    Answer:   [facility] (table limit-550lbs)   All questions were answered. The patient knows to call the clinic with any problems, questions or concerns. No barriers  to learning was detected. The total time spent in the appointment was 30 minutes.     Truitt Merle, MD 04/05/2021   I, Wilburn Mylar, am acting as scribe for Truitt Merle, MD.   I have reviewed the above documentation for accuracy and completeness, and I agree with the above.

## 2021-04-15 ENCOUNTER — Ambulatory Visit: Payer: BC Managed Care – PPO

## 2021-05-22 ENCOUNTER — Telehealth: Payer: Self-pay

## 2021-05-22 NOTE — Telephone Encounter (Signed)
Pt called to let Dr. Burr Medico know she stopped taking Tamoxifen on 05/16/2022 d/t hot flashes and other side effects.  Informed pt that this RN will notify Dr. Burr Medico.

## 2021-06-10 ENCOUNTER — Ambulatory Visit: Payer: BC Managed Care – PPO

## 2021-06-18 ENCOUNTER — Ambulatory Visit: Payer: BC Managed Care – PPO | Attending: General Surgery | Admitting: Rehabilitation

## 2021-06-18 ENCOUNTER — Other Ambulatory Visit: Payer: Self-pay

## 2021-06-18 ENCOUNTER — Encounter: Payer: Self-pay | Admitting: Rehabilitation

## 2021-06-18 DIAGNOSIS — R293 Abnormal posture: Secondary | ICD-10-CM | POA: Insufficient documentation

## 2021-06-18 DIAGNOSIS — R600 Localized edema: Secondary | ICD-10-CM | POA: Insufficient documentation

## 2021-06-18 DIAGNOSIS — C50311 Malignant neoplasm of lower-inner quadrant of right female breast: Secondary | ICD-10-CM | POA: Insufficient documentation

## 2021-06-18 DIAGNOSIS — Z483 Aftercare following surgery for neoplasm: Secondary | ICD-10-CM | POA: Insufficient documentation

## 2021-06-18 DIAGNOSIS — Z170 Estrogen receptor positive status [ER+]: Secondary | ICD-10-CM | POA: Insufficient documentation

## 2021-06-18 DIAGNOSIS — M25611 Stiffness of right shoulder, not elsewhere classified: Secondary | ICD-10-CM | POA: Insufficient documentation

## 2021-06-18 NOTE — Therapy (Signed)
[facility] @ [place] [place], [place], [address] Phone: [phone]   Fax:  [phone]  Physical Therapy Treatment  Patient Details  Name: Ariel Andersen MRN: [id number] Date of Birth: 03/06/1966 No data recorded  Encounter Date: 06/18/2021   PT End of Session - 06/18/21 1213     Visit Number 3   screen only            Past Medical History:  Diagnosis Date   Arthritis of knee, left    Breast cancer ([place])    Family history of pancreatic cancer 01/12/2020   Hypertension    Pre-diabetes    per patient   Seasonal allergies     Past Surgical History:  Procedure Laterality Date   BACK SURGERY  2017   BREAST LUMPECTOMY WITH RADIOACTIVE SEED AND SENTINEL LYMPH NODE BIOPSY Right 03/07/2020   Procedure: RIGHT BREAST REDUCTION LUMPECTOMY WITH RADIOACTIVE SEED;  Surgeon: Jovita Kussmaul, MD;  Location: [facility];  Service: General;  Laterality: Right;   BREAST REDUCTION SURGERY Bilateral 03/07/2020   Procedure: MAMMARY REDUCTION  (BREAST) ONCOPLASTIC REDUCTION;  Surgeon: Wallace Going, DO;  Location: [facility];  Service: Plastics;  Laterality: Bilateral;   SENTINEL NODE BIOPSY Right 03/07/2020   Procedure: RIGHT SENTINEL LYMPH NODE BIOPSY;  Surgeon: Jovita Kussmaul, MD;  Location: [facility];  Service: General;  Laterality: Right;    There were no vitals filed for this visit.   Subjective Assessment - 06/18/21 1212     Subjective pt returns for 6 month LDEX    Pertinent History Patient was diagnosed on 12/05/2019 with right intermediate grade DCIS. She underwent a right lumpectomy and sentinel node biopsy on 03/07/2020 with 2 negative nodes removed and a bilateral breast reduction. It is ER/PR positive.                    L-DEX FLOWSHEETS - 06/18/21 1200       L-DEX LYMPHEDEMA SCREENING   Measurement Type Unilateral    L-DEX MEASUREMENT EXTREMITY Upper Extremity    POSITION  Standing    DOMINANT SIDE  Right    At Risk Side Right    BASELINE SCORE (UNILATERAL) -5.9    L-DEX SCORE (UNILATERAL) -3.7    VALUE CHANGE (UNILAT) 2.2                                     PT Long Term Goals - 04/11/20 1147       PT LONG TERM GOAL #1   Title Patient will demonstrate she has regained full shoulder ROM and function post operatively compared to baselines.    Status Achieved      PT LONG TERM GOAL #2   Title Patient will increase right shoulder active flexion to be >/= 150 degrees for increased ease reaching overhead.    Baseline 128    Status Achieved      PT LONG TERM GOAL #3   Title Patient will increase right shoulder active abduction to be >/= 160 degrees for increased ease obtaining radiation positioning.    Baseline 135    Status Achieved      PT LONG TERM GOAL #4   Title Patient will improve the DASH score to be </= 10 for improved overall upper extremity function.    Baseline 86.36    Status Deferred   forgot to  asses, but pt states she has no problems                  Plan - 06/18/21 1214     Clinical Impression Statement SOZO still WNL and no intervention needed at this time.  Will continue at 80month screen.             Patient will benefit from skilled therapeutic intervention in order to improve the following deficits and impairments:     Visit Diagnosis: Aftercare following surgery for neoplasm  Malignant neoplasm of lower-inner quadrant of right breast of female, estrogen receptor positive ([place])  Abnormal posture  Stiffness of right shoulder, not elsewhere classified  Localized edema     Problem List Patient Active Problem List   Diagnosis Date Noted   Breast asymmetry following reconstructive surgery 12/07/2020   History of breast cancer 12/07/2020   Family history of pancreatic cancer 01/12/2020   Ductal carcinoma in situ (DCIS) of right breast 01/06/2020    Ariel Andersen, PT 06/18/2021, 12:15 PM  [facility] @ [place], [place], [address] Phone: [phone]   Fax:  [phone]  Name: Ariel Andersen MRN: [id number] Date of Birth: 08/15/1965

## 2021-06-27 ENCOUNTER — Encounter (HOSPITAL_COMMUNITY): Payer: Self-pay

## 2021-07-03 ENCOUNTER — Inpatient Hospital Stay: Payer: BC Managed Care – PPO | Attending: Hematology | Admitting: Hematology

## 2021-07-03 DIAGNOSIS — D0511 Intraductal carcinoma in situ of right breast: Secondary | ICD-10-CM

## 2021-07-03 NOTE — Progress Notes (Signed)
[facility]   [phone]) [phone] [phone]   Clinic Follow up Note   Patient Care Team: Harlan Stains, MD as PCP - General (Family Medicine) Rockwell Germany, RN as Oncology Nurse Navigator Mauro Kaufmann, RN as Oncology Nurse Navigator Truitt Merle, MD as Consulting Physician (Hematology) Jovita Kussmaul, MD as Consulting Physician (General Surgery) Kyung Rudd, MD as Consulting Physician (Radiation Oncology) Alla Feeling, NP as Nurse Practitioner (Nurse Practitioner)  Date of Service:  07/03/2021  I connected with Ariel Andersen on 07/03/2021 at 11:20 AM EST by telephone visit and verified that I am speaking with the correct person using two identifiers.  I discussed the limitations, risks, security and privacy concerns of performing an evaluation and management service by telephone and the availability of in person appointments. I also discussed with the patient that there may be a patient responsible charge related to this service. The patient expressed understanding and agreed to proceed.   Other persons participating in the visit and their role in the encounter:  none  Patient's location:  home Provider's location:  my office  CHIEF COMPLAINT: f/u of right breast DCIS  CURRENT THERAPY:  Surveillance  ASSESSMENT & PLAN:  Ariel Andersen is a 56 y.o. female with   1. Right breast DCIS, Intermediate grade II, ER+/PR+, ADH, LDH  -She was diagnosed in 12/2019 with 4cm DCIS in right breast, f/p right lumpectomy and SLNB by Dr Marlou Starks on 03/07/20 and adjuvant Radiation with Dr Lisbeth Renshaw 04/16/20-06/05/20.  -she began tamoxifen 08/27/20. She is experiencing worsening hot flashes. She tried 10 mg BID and then 10 mg once daily, but she still had hot flashes. She discontinued the medicine 05/16/21. -most recent MM 01/30/21 at [facility] was negative, per pt. -I reviewed screening breast MRI with her today. I reviewed the benefits and dilemmas of this. I will order  one for her and see if we can get it approved with her insurance. -we will move her f/u out to a year from now   2. Hot Flashes -we previously discussed adding medication (SSRI or gabapentin) to relieve the hot flashes if she feels they are intolerable. She previously declined.   3. Hypokalemia -potassium has been trending low. This is probably related to her HCTZ.  -I previously prescribed supplement, which she took until she ran out. I recommend she continue given she is on HCTZ. I will refill for her today.   4. Comorbidities: HTN, Pre-diabetic, arthritis, Sleep Apnea -She is on medication for HTN and has not required medication for diabetes. She monitors her BP and sugar daily.  -She has chronic arthritis. She has undergone lumbar spine surgery around 2017 and back pain now occasional. She planned to do left knee surgery in 01/2020 but this has been postponed given recent breast cancer diagnosis.     PLAN:  -cancel appointments in 09/2021 -f/u in one year with NP Lacie (no lab) Screening breast MRI in late Dec 2023, ordered today and I gave her radiology number to call and schedule    No problem-specific Assessment & Plan notes found for this encounter.    SUMMARY OF ONCOLOGIC HISTORY: Oncology History Overview Note  Cancer Staging Ductal carcinoma in situ (DCIS) of right breast Staging form: Breast, AJCC 8th Edition - Clinical stage from 01/11/2020: Stage 0 (cTis (DCIS), cN0, cM0, ER+, PR+) - Unsigned    Ductal carcinoma in situ (DCIS) of right breast  12/05/2019 Mammogram   Diagnostic Mammogram 12/05/19  IMPRESSION The  4cm span of segmental pleomorphic calcifications in the right breast lower inner aspect middle depth 7cm from the nipple are suspicious of malignancy.   01/04/2020 Initial Biopsy   Diagnosis  Breast, right, needle core biopsy, RLMQ, middle depth, 7cmfn -DUCTAL CARCINOMA IN SITU INVOLVING A PAPILLARY LESION -USUAL DUCTAL HYPERPLASIA AND COLUMNAR CELL  CHANGE -SEE COMMENT   01/04/2020 Receptors her2   ER - 95% positive moderate staining  PR - 99% positive strong staining     01/06/2020 Initial Diagnosis   Ductal carcinoma in situ (DCIS) of right breast   03/07/2020 Surgery   RIGHT BREAST REDUCTION LUMPECTOMY WITH RADIOACTIVE SEED and RIGHT SENTINEL LYMPH NODE BIOPSY by Dr Marlou Starks   MAMMARY REDUCTION  (BREAST) ONCOPLASTIC REDUCTION by Dr Marla Roe    03/07/2020 Pathology Results   FINAL MICROSCOPIC DIAGNOSIS:   A. BREAST, RIGHT, LUMPECTOMY:  -  Ductal carcinoma in situ, low grade, 3.4 cm  -  Ductal carcinoma involves multiple papillary lesions  -  Margins uninvolved by carcinoma (<0.1 cm; anterior margin)  -  Previous biopsy site changes  -  See oncology table and comment below   B. LYMPH NODE, RIGHT AXILLARY, SENTINEL, EXCISION:  -  No carcinoma identified in one lymph node (0/1)   C. LYMPH NODE, RIGHT AXILLARY, SENTINEL, EXCISION:  -  No carcinoma identified in one lymph node (0/1)   D. BREAST, LEFT, MAMMOPLASTY:  -  Usual ductal hyperplasia and fibrocystic changes with apocrine  metaplasia  -  Fibroadenomatoid nodule  -  No carcinoma identified  -  See comment   E. BREAST, RIGHT, MAMMOPLASTY:  -  Adenosis and fibrocystic changes with apocrine metaplasia  -  No carcinoma identified   F. BREAST, RIGHT ADDITIONAL INFERIOR MARGIN, EXCISION:  -  Atypical ductal hyperplasia involving intraductal papillomas  -  Lobular neoplasm (atypical lobular hyperplasia  -  See comment   COMMENT:   A.  The resection specimen has multiple papillary lesions, several of  which are involved by ductal carcinoma in situ.  Immunohistochemistry  (SMM, calponin, p63 and cytokeratin 5/6) was utilized to assess for an  invasive component and atypia at the inked resection margins.  Cytokeratin 5/6 is negative and foci of ductal carcinoma in situ.  Myoepithelial markers are retained supporting the absence of an invasive  component.   D.   Cytokeratin 5/6 is positive and/or has mosaic pattern expression and  foci of usual ductal hyperplasia.   E.  AMENDMENT NOTE: DIAGNOSTIC INFORMATION HAS NOT BEEN CHANGED. The  original report had a typographical error in part E.  The letter "F" was  inserted prior to the diagnosis.  This has been removed.   F. Cytokeratin 5/6 is negative and foci of atypical ductal hyperplasia.  Myoepithelial markers (SMM, p63 and calponin) are retained supporting  the absence of an invasive component.  E-cadherin is negative in the  foci of lobular neoplasia.    03/07/2020 Cancer Staging   Staging form: Breast, AJCC 8th Edition - Pathologic stage from 03/07/2020: Stage 0 (pTis (DCIS), pN0, cM0, G1, ER+, PR+, HER2: Not Assessed) - Signed by Alla Feeling, NP on 08/27/2020 Histologic grading system: 3 grade system    04/16/2020 - 06/05/2020 Radiation Therapy   Adjuvant Radiation with Dr Lisbeth Renshaw    08/27/2020 -  Anti-estrogen oral therapy   Tamoxifen 20 mg po once daily   08/27/2020 Survivorship   SCP delivered by Cira Rue, NP      INTERVAL HISTORY:  Nili Honda Wolter was contacted  for a follow up of DCIS. She was last seen by me on 04/05/21. She reports she continues to have hot flashes despite being off the tamoxifen.    All other systems were reviewed with the patient and are negative.  MEDICAL HISTORY:  Past Medical History:  Diagnosis Date   Arthritis of knee, left    Breast cancer ([place])    Family history of pancreatic cancer 01/12/2020   Hypertension    Pre-diabetes    per patient   Seasonal allergies     SURGICAL HISTORY: Past Surgical History:  Procedure Laterality Date   BACK SURGERY  2017   BREAST LUMPECTOMY WITH RADIOACTIVE SEED AND SENTINEL LYMPH NODE BIOPSY Right 03/07/2020   Procedure: RIGHT BREAST REDUCTION LUMPECTOMY WITH RADIOACTIVE SEED;  Surgeon: Jovita Kussmaul, MD;  Location: [place];  Service: General;  Laterality: Right;   BREAST REDUCTION SURGERY Bilateral  03/07/2020   Procedure: MAMMARY REDUCTION  (BREAST) ONCOPLASTIC REDUCTION;  Surgeon: Wallace Going, DO;  Location: [place];  Service: Plastics;  Laterality: Bilateral;   SENTINEL NODE BIOPSY Right 03/07/2020   Procedure: RIGHT SENTINEL LYMPH NODE BIOPSY;  Surgeon: Jovita Kussmaul, MD;  Location: [place];  Service: General;  Laterality: Right;    I have reviewed the social history and family history with the patient and they are unchanged from previous note.  ALLERGIES:  has No Known Allergies.  MEDICATIONS:  Current Outpatient Medications  Medication Sig Dispense Refill   acetaminophen (TYLENOL) 500 MG tablet Take 1 tablet (500 mg total) by mouth every 6 (six) hours as needed. For use AFTER surgery 30 tablet 0   amLODipine (NORVASC) 5 MG tablet Take 5 mg by mouth daily.     aspirin 81 MG EC tablet Take 81 mg by mouth daily.      atorvastatin (LIPITOR) 10 MG tablet Take 10 mg by mouth daily.     Biotin 10000 MCG TABS Take 10,000 mcg by mouth daily.      Blood Glucose Monitoring Suppl (CONTOUR NEXT ONE) KIT See admin instructions.     Cholecalciferol (VITAMIN D3 MAXIMUM STRENGTH PO) 1 capsule     CONTOUR NEXT TEST test strip daily.     ferrous sulfate 325 (65 FE) MG EC tablet Take 325 mg by mouth daily.      fluocinolone (SYNALAR) 0.01 % external solution Apply 1 application topically daily as needed (scalp irritation).     hydrochlorothiazide (HYDRODIURIL) 25 MG tablet Take 25 mg by mouth every morning.     HYDROcodone-acetaminophen (NORCO/VICODIN) 5-325 MG tablet hydrocodone 5 mg-acetaminophen 325 mg tablet     ibuprofen (ADVIL) 600 MG tablet Take 1 tablet (600 mg total) by mouth every 6 (six) hours as needed for mild pain or moderate pain. For use AFTER surgery 30 tablet 0   metFORMIN (GLUCOPHAGE-XR) 500 MG 24 hr tablet SMARTSIG:1 Tablet(s) By Mouth Every Evening     Microlet Lancets MISC daily.     Multiple Vitamins-Minerals (MULTIVITAMIN WOMEN PO) Take 1 tablet by mouth daily.      potassium chloride SA (KLOR-CON) 20 MEQ tablet Take 1 tablet (20 mEq total) by mouth 2 (two) times daily. 40 tablet 0   tamoxifen (NOLVADEX) 10 MG tablet Take 1 tablet (10 mg total) by mouth 2 (two) times daily. 60 tablet 3   triamcinolone cream (KENALOG) 0.1 % Apply 1 application topically daily as needed (eczema).      No current facility-administered medications for this visit.    PHYSICAL EXAMINATION: ECOG  PERFORMANCE STATUS: 0 - Asymptomatic  There were no vitals filed for this visit. Wt Readings from Last 3 Encounters:  04/05/21 188 lb 4.8 oz (85.4 kg)  12/24/20 186 lb 2 oz (84.4 kg)  11/30/20 186 lb 14.4 oz (84.8 kg)     No vitals taken today, Exam not performed today  LABORATORY DATA:  I have reviewed the data as listed CBC Latest Ref Rng & Units 04/05/2021 11/30/2020 03/01/2020  WBC 4.0 - 10.5 K/uL 4.1 4.2 5.2  Hemoglobin 12.0 - 15.0 g/dL 11.9(L) 12.9 12.7  Hematocrit 36.0 - 46.0 % 37.7 40.0 42.7  Platelets 150 - 400 K/uL 200 191 214     CMP Latest Ref Rng & Units 04/05/2021 11/30/2020 03/01/2020  Glucose 70 - 99 mg/dL 188(H) 111(H) 143(H)  BUN 6 - 20 mg/dL _0 Creatinine 0.44 - 1.00 mg/dL 0.82 0.84 0.73  Sodium 135 - 145 mmol/L 140 139 140  Potassium 3.5 - 5.1 mmol/L 2.9(L) 3.0(L) 3.2(L)  Chloride 98 - 111 mmol/L 100 99 101  CO2 22 - 32 mmol/L _1 Calcium 8.9 - 10.3 mg/dL 9.4 9.3 9.5  Total Protein 6.5 - 8.1 g/dL 6.9 7.3 -  Total Bilirubin 0.3 - 1.2 mg/dL 0.5 0.7 -  Alkaline Phos 38 - 126 U/L 55 54 -  AST 15 - 41 U/L 33 35 -  ALT 0 - 44 U/L 31 31 -      RADIOGRAPHIC STUDIES: I have personally reviewed the radiological images as listed and agreed with the findings in the report. No results found.    Orders Placed This Encounter  Procedures   MR BREAST BILATERAL W WO CONTRAST INC CAD    Standing Status:   Future    Standing Expiration Date:   07/03/2022    Order Specific Question:   If indicated for the ordered procedure, I authorize the  administration of contrast media per Radiology protocol    Answer:   Yes    Order Specific Question:   What is the patient's sedation requirement?    Answer:   No Sedation    Order Specific Question:   Does the patient have a pacemaker or implanted devices?    Answer:   No    Order Specific Question:   Radiology Contrast Protocol - do NOT remove file path    Answer:   [email or web address]    Order Specific Question:   Preferred imaging location?    Answer:   [facility] (table limit-550lbs)   All questions were answered. The patient knows to call the clinic with any problems, questions or concerns. No barriers to learning was detected. The total time spent in the appointment was 15 minutes.     Truitt Merle, MD 07/03/2021   I, Wilburn Mylar, am acting as scribe for Truitt Merle, MD.   I have reviewed the above documentation for accuracy and completeness, and I agree with the above.

## 2021-07-04 ENCOUNTER — Telehealth: Payer: Self-pay | Admitting: Hematology

## 2021-07-04 NOTE — Telephone Encounter (Signed)
Scheduled follow-up appointment per 2/15 los. Patient is aware. 

## 2021-09-13 ENCOUNTER — Ambulatory Visit: Payer: BC Managed Care – PPO | Admitting: Plastic Surgery

## 2021-09-23 ENCOUNTER — Ambulatory Visit: Payer: BC Managed Care – PPO | Attending: General Surgery

## 2021-09-23 VITALS — Wt 182.0 lb

## 2021-09-23 DIAGNOSIS — Z483 Aftercare following surgery for neoplasm: Secondary | ICD-10-CM | POA: Insufficient documentation

## 2021-09-23 NOTE — Therapy (Signed)
?  OUTPATIENT PHYSICAL THERAPY SOZO SCREENING NOTE ? ? ?Patient Name: Ariel Andersen ?MRN: [id number] ?DOB:11/15/1965, 56 y.o., female ?Today's Date: 09/23/2021 ? ?PCP: Harlan Stains, MD ?REFERRING PROVIDER: Harlan Stains, MD ? ? PT End of Session - 09/23/21 1628   ? ? Visit Number 3   # unchanged due to screen only  ? PT Start Time 1627   ? PT Stop Time 2263   ? PT Time Calculation (min) 4 min   ? ?  ?  ? ?  ? ? ?Past Medical History:  ?Diagnosis Date  ? Arthritis of knee, left   ? Breast cancer ([place])   ? Family history of pancreatic cancer 01/12/2020  ? Hypertension   ? Pre-diabetes   ? per patient  ? Seasonal allergies   ? ?Past Surgical History:  ?Procedure Laterality Date  ? BACK SURGERY  2017  ? BREAST LUMPECTOMY WITH RADIOACTIVE SEED AND SENTINEL LYMPH NODE BIOPSY Right 03/07/2020  ? Procedure: RIGHT BREAST REDUCTION LUMPECTOMY WITH RADIOACTIVE SEED;  Surgeon: Jovita Kussmaul, MD;  Location: [place];  Service: General;  Laterality: Right;  ? BREAST REDUCTION SURGERY Bilateral 03/07/2020  ? Procedure: MAMMARY REDUCTION  (BREAST) ONCOPLASTIC REDUCTION;  Surgeon: Wallace Going, DO;  Location: [place];  Service: Plastics;  Laterality: Bilateral;  ? SENTINEL NODE BIOPSY Right 03/07/2020  ? Procedure: RIGHT SENTINEL LYMPH NODE BIOPSY;  Surgeon: Jovita Kussmaul, MD;  Location: [place];  Service: General;  Laterality: Right;  ? ?Patient Active Problem List  ? Diagnosis Date Noted  ? Breast asymmetry following reconstructive surgery 12/07/2020  ? History of breast cancer 12/07/2020  ? Family history of pancreatic cancer 01/12/2020  ? Ductal carcinoma in situ (DCIS) of right breast 01/06/2020  ? ? ?REFERRING DIAG: right breast cancer at risk for lymphedema ? ?THERAPY DIAG:  ?Aftercare following surgery for neoplasm ? ?PERTINENT HISTORY: Patient was diagnosed on 12/05/2019 with right intermediate grade DCIS. She underwent a right lumpectomy and sentinel node biopsy on 03/07/2020 with 2 negative nodes removed and a  bilateral breast reduction. It is ER/PR positive.  ? ?PRECAUTIONS: right UE Lymphedema risk, None ? ?SUBJECTIVE: Pt returns for her 3 month L-Dex screen. ? ?PAIN:  ?Are you having pain? No ? ?SOZO SCREENING: ?Patient was assessed today using the SOZO machine to determine the lymphedema index score. This was compared to her baseline score. It was determined that she is within the recommended range when compared to her baseline and no further action is needed at this time. She will continue SOZO screenings. These are done every 3 months for 2 years post operatively followed by every 6 months for 2 years, and then annually. ? ? ? ?Otelia Limes, PTA ?09/23/2021, 4:31 PM ? ?  ? ?

## 2021-10-04 ENCOUNTER — Ambulatory Visit: Payer: BC Managed Care – PPO | Admitting: Hematology

## 2021-10-04 ENCOUNTER — Other Ambulatory Visit: Payer: BC Managed Care – PPO

## 2021-11-29 ENCOUNTER — Ambulatory Visit: Payer: BC Managed Care – PPO | Admitting: Plastic Surgery

## 2021-12-20 ENCOUNTER — Ambulatory Visit: Payer: BC Managed Care – PPO | Admitting: Plastic Surgery

## 2021-12-20 ENCOUNTER — Encounter: Payer: Self-pay | Admitting: Plastic Surgery

## 2021-12-20 DIAGNOSIS — D0511 Intraductal carcinoma in situ of right breast: Secondary | ICD-10-CM

## 2021-12-20 DIAGNOSIS — N651 Disproportion of reconstructed breast: Secondary | ICD-10-CM | POA: Diagnosis not present

## 2021-12-20 NOTE — Progress Notes (Signed)
   Subjective:    Patient ID: Ariel Andersen, female    DOB: February 21, 1966, 56 y.o.   MRN: [id number]  Patient is a 56 year old female here for follow-up on her breast reconstruction.  She was diagnosed with right ductal carcinoma in situ in September 2021.  She is 5 feet 4 inches tall and was a 44 DD cup preoperatively with grade 3 ptosis.  She opted for a partial mastectomy with bilateral oncoplastic breast reduction.  She then went on to have radiation which ended over 1 year ago.  She has noticed firmness in the right breast mostly in the medial aspect.  This is most likely related to the radiation.  She is due for mammogram in September.  She does not have any other areas of concern.     Review of Systems  Constitutional: Negative.   Eyes: Negative.   Respiratory: Negative.  Negative for chest tightness.   Cardiovascular: Negative.   Gastrointestinal: Negative.   Endocrine: Negative.   Genitourinary: Negative.   Musculoskeletal: Negative.        Objective:   Physical Exam Vitals and nursing note reviewed.  Constitutional:      Appearance: Normal appearance.  HENT:     Head: Normocephalic and atraumatic.  Cardiovascular:     Rate and Rhythm: Normal rate.     Pulses: Normal pulses.  Pulmonary:     Effort: Pulmonary effort is normal.  Musculoskeletal:        General: No swelling or deformity.  Skin:    General: Skin is warm.     Capillary Refill: Capillary refill takes less than 2 seconds.     Coloration: Skin is not jaundiced.     Findings: No bruising.  Neurological:     Mental Status: She is alert and oriented to person, place, and time.  Psychiatric:        Mood and Affect: Mood normal.        Behavior: Behavior normal.        Thought Content: Thought content normal.        Judgment: Judgment normal.        Assessment & Plan:     ICD-10-CM   1. Ductal carcinoma in situ (DCIS) of right breast  D05.11     2. Breast asymmetry following reconstructive surgery   N65.1       Pictures were obtained of the patient and placed in the chart with the patient's or guardian's permission.  Plan for evaluation of the mammogram in September.  She then has a trip to [place] in October.  She will call us when she gets back.  She is a candidate for release of the fat necrosis and excision.  She is going to think about whether or not she wants to do that but wants to get the mammogram first.  We will wait to hear back from her.

## 2022-02-07 ENCOUNTER — Encounter: Payer: Self-pay | Admitting: Hematology

## 2022-02-10 ENCOUNTER — Ambulatory Visit: Payer: BC Managed Care – PPO

## 2022-02-24 ENCOUNTER — Ambulatory Visit: Payer: BC Managed Care – PPO

## 2022-03-10 ENCOUNTER — Ambulatory Visit: Payer: BC Managed Care – PPO | Attending: General Surgery

## 2022-03-10 VITALS — Wt 178.1 lb

## 2022-03-10 DIAGNOSIS — Z483 Aftercare following surgery for neoplasm: Secondary | ICD-10-CM | POA: Insufficient documentation

## 2022-03-10 NOTE — Therapy (Signed)
  OUTPATIENT PHYSICAL THERAPY SOZO SCREENING NOTE   Patient Name: Ariel Andersen MRN: [id number] DOB:1965-12-28, 56 y.o., female Today's Date: 03/10/2022  PCP: Harlan Stains, MD REFERRING PROVIDER: Jovita Kussmaul, MD   PT End of Session - 03/10/22 1633     Visit Number 3   # unchanged due to screen only   PT Start Time 9518    PT Stop Time 1635    PT Time Calculation (min) 4 min    Activity Tolerance Patient tolerated treatment well    Behavior During Therapy [facility] for tasks assessed/performed             Past Medical History:  Diagnosis Date   Arthritis of knee, left    Breast cancer ([place])    Family history of pancreatic cancer 01/12/2020   Hypertension    Pre-diabetes    per patient   Seasonal allergies    Past Surgical History:  Procedure Laterality Date   BACK SURGERY  2017   BREAST LUMPECTOMY WITH RADIOACTIVE SEED AND SENTINEL LYMPH NODE BIOPSY Right 03/07/2020   Procedure: RIGHT BREAST REDUCTION LUMPECTOMY WITH RADIOACTIVE SEED;  Surgeon: Jovita Kussmaul, MD;  Location: [place];  Service: General;  Laterality: Right;   BREAST REDUCTION SURGERY Bilateral 03/07/2020   Procedure: MAMMARY REDUCTION  (BREAST) ONCOPLASTIC REDUCTION;  Surgeon: Wallace Going, DO;  Location: [place];  Service: Plastics;  Laterality: Bilateral;   SENTINEL NODE BIOPSY Right 03/07/2020   Procedure: RIGHT SENTINEL LYMPH NODE BIOPSY;  Surgeon: Jovita Kussmaul, MD;  Location: [place];  Service: General;  Laterality: Right;   Patient Active Problem List   Diagnosis Date Noted   Breast asymmetry following reconstructive surgery 12/07/2020   History of breast cancer 12/07/2020   Family history of pancreatic cancer 01/12/2020   Ductal carcinoma in situ (DCIS) of right breast 01/06/2020    REFERRING DIAG: right breast cancer at risk for lymphedema  THERAPY DIAG: Aftercare following surgery for neoplasm  PERTINENT HISTORY: Patient was diagnosed on 12/05/2019 with right intermediate grade  DCIS. She underwent a right lumpectomy and sentinel node biopsy on 03/07/2020 with 2 negative nodes removed and a bilateral breast reduction. It is ER/PR positive.   PRECAUTIONS: right UE Lymphedema risk, None  SUBJECTIVE: Pt returns for her 3 month L-Dex screen.  PAIN:  Are you having pain? No  SOZO SCREENING: Patient was assessed today using the SOZO machine to determine the lymphedema index score. This was compared to her baseline score. It was determined that she is within the recommended range when compared to her baseline and no further action is needed at this time. She will continue SOZO screenings. These are done every 3 months for 2 years post operatively followed by every 6 months for 2 years, and then annually.   L-DEX FLOWSHEETS - 03/10/22 1600       L-DEX LYMPHEDEMA SCREENING   Measurement Type Unilateral    L-DEX MEASUREMENT EXTREMITY Upper Extremity    POSITION  Standing    DOMINANT SIDE Right    At Risk Side Right    BASELINE SCORE (UNILATERAL) -5.9    L-DEX SCORE (UNILATERAL) -2    VALUE CHANGE (UNILAT) 3.9              Otelia Limes, PTA 03/10/2022, 4:35 PM

## 2022-03-31 ENCOUNTER — Ambulatory Visit
Admission: RE | Admit: 2022-03-31 | Discharge: 2022-03-31 | Disposition: A | Discharge status: Home / Self Care | Payer: BC Managed Care – PPO | Source: Ambulatory Visit | Attending: Hematology | Admitting: Hematology

## 2022-03-31 DIAGNOSIS — D0511 Intraductal carcinoma in situ of right breast: Secondary | ICD-10-CM

## 2022-03-31 MED ORDER — GADOPICLENOL 0.5 MMOL/ML IV SOLN
8.0000 mL | Freq: Once | INTRAVENOUS | Status: AC | PRN
  Administered 2022-03-31: 8 mL via INTRAVENOUS

## 2022-05-05 ENCOUNTER — Other Ambulatory Visit: Payer: Self-pay | Admitting: Obstetrics and Gynecology

## 2022-05-05 DIAGNOSIS — E049 Nontoxic goiter, unspecified: Secondary | ICD-10-CM

## 2022-05-20 ENCOUNTER — Telehealth: Payer: Self-pay

## 2022-05-20 NOTE — Telephone Encounter (Signed)
Pt LVM to give her a call.  Returned pt's call and pt stated she needed the AVS from her last office visit with Dr. Burr Medico and with her plastic surgeon.  Pt stated she was able to obtain the AVS from her surgeon's office for both providers.  Pt had no further questions or concerns.

## 2022-06-30 ENCOUNTER — Ambulatory Visit: Payer: BC Managed Care – PPO | Attending: General Surgery

## 2022-06-30 VITALS — Wt 173.5 lb

## 2022-06-30 DIAGNOSIS — Z483 Aftercare following surgery for neoplasm: Secondary | ICD-10-CM | POA: Insufficient documentation

## 2022-06-30 NOTE — Therapy (Signed)
  OUTPATIENT PHYSICAL THERAPY SOZO SCREENING NOTE   Patient Name: Ariel Andersen MRN: [id number] DOB:15-Dec-1965, 57 y.o., female Today's Date: 06/30/2022  PCP: Harlan Stains, MD REFERRING PROVIDER: Jovita Kussmaul, MD   PT End of Session - 06/30/22 1612     Visit Number 3   # unchanged due to screen only   PT Start Time 1609    PT Stop Time 1613    PT Time Calculation (min) 4 min    Activity Tolerance Patient tolerated treatment well    Behavior During Therapy [facility] for tasks assessed/performed             Past Medical History:  Diagnosis Date   Arthritis of knee, left    Breast cancer ([place])    Family history of pancreatic cancer 01/12/2020   Hypertension    Pre-diabetes    per patient   Seasonal allergies    Past Surgical History:  Procedure Laterality Date   BACK SURGERY  2017   BREAST LUMPECTOMY WITH RADIOACTIVE SEED AND SENTINEL LYMPH NODE BIOPSY Right 03/07/2020   Procedure: RIGHT BREAST REDUCTION LUMPECTOMY WITH RADIOACTIVE SEED;  Surgeon: Jovita Kussmaul, MD;  Location: [place];  Service: General;  Laterality: Right;   BREAST REDUCTION SURGERY Bilateral 03/07/2020   Procedure: MAMMARY REDUCTION  (BREAST) ONCOPLASTIC REDUCTION;  Surgeon: Wallace Going, DO;  Location: [place];  Service: Plastics;  Laterality: Bilateral;   SENTINEL NODE BIOPSY Right 03/07/2020   Procedure: RIGHT SENTINEL LYMPH NODE BIOPSY;  Surgeon: Jovita Kussmaul, MD;  Location: [place];  Service: General;  Laterality: Right;   Patient Active Problem List   Diagnosis Date Noted   Breast asymmetry following reconstructive surgery 12/07/2020   History of breast cancer 12/07/2020   Family history of pancreatic cancer 01/12/2020   Ductal carcinoma in situ (DCIS) of right breast 01/06/2020    REFERRING DIAG: right breast cancer at risk for lymphedema  THERAPY DIAG: Aftercare following surgery for neoplasm  PERTINENT HISTORY: Patient was diagnosed on 12/05/2019 with right intermediate grade DCIS.  She underwent a right lumpectomy and sentinel node biopsy on 03/07/2020 with 2 negative nodes removed and a bilateral breast reduction. It is ER/PR positive.   PRECAUTIONS: right UE Lymphedema risk, None  SUBJECTIVE: Pt returns for her 3 month L-Dex screen.  PAIN:  Are you having pain? No  SOZO SCREENING: Patient was assessed today using the SOZO machine to determine the lymphedema index score. This was compared to her baseline score. It was determined that she is within the recommended range when compared to her baseline and no further action is needed at this time. She will continue SOZO screenings. These are done every 3 months for 2 years post operatively followed by every 6 months for 2 years, and then annually.   L-DEX FLOWSHEETS - 06/30/22 1600       L-DEX LYMPHEDEMA SCREENING   Measurement Type Unilateral    L-DEX MEASUREMENT EXTREMITY Upper Extremity    POSITION  Standing    DOMINANT SIDE Right    At Risk Side Right    BASELINE SCORE (UNILATERAL) -5.9    L-DEX SCORE (UNILATERAL) -4.2    VALUE CHANGE (UNILAT) 1.7              Otelia Limes, PTA 06/30/2022, 4:13 PM

## 2022-07-08 ENCOUNTER — Other Ambulatory Visit: Payer: Self-pay | Admitting: Family Medicine

## 2022-07-08 ENCOUNTER — Other Ambulatory Visit: Payer: Self-pay | Admitting: Nurse Practitioner

## 2022-07-08 DIAGNOSIS — D0511 Intraductal carcinoma in situ of right breast: Secondary | ICD-10-CM

## 2022-07-08 DIAGNOSIS — E049 Nontoxic goiter, unspecified: Secondary | ICD-10-CM

## 2022-07-08 NOTE — Progress Notes (Unsigned)
Patient Care Team: Harlan Stains, MD as PCP - General (Family Medicine) Rockwell Germany, RN as Oncology Nurse Navigator Mauro Kaufmann, RN as Oncology Nurse Navigator Truitt Merle, MD as Consulting Physician (Hematology) Jovita Kussmaul, MD as Consulting Physician (General Surgery) Kyung Rudd, MD as Consulting Physician (Radiation Oncology) Alla Feeling, NP as Nurse Practitioner (Nurse Practitioner)   CHIEF COMPLAINT: Follow up right breast DCIS  Oncology History Overview Note  Cancer Staging Ductal carcinoma in situ (DCIS) of right breast Staging form: Breast, AJCC 8th Edition - Clinical stage from 01/11/2020: Stage 0 (cTis (DCIS), cN0, cM0, ER+, PR+) - Unsigned    Ductal carcinoma in situ (DCIS) of right breast  12/05/2019 Mammogram   Diagnostic Mammogram 12/05/19  IMPRESSION The 4cm span of segmental pleomorphic calcifications in the right breast lower inner aspect middle depth 7cm from the nipple are suspicious of malignancy.   01/04/2020 Initial Biopsy   Diagnosis  Breast, right, needle core biopsy, RLMQ, middle depth, 7cmfn -DUCTAL CARCINOMA IN SITU INVOLVING A PAPILLARY LESION -USUAL DUCTAL HYPERPLASIA AND COLUMNAR CELL CHANGE -SEE COMMENT   01/04/2020 Receptors her2   ER - 95% positive moderate staining  PR - 99% positive strong staining     01/06/2020 Initial Diagnosis   Ductal carcinoma in situ (DCIS) of right breast   03/07/2020 Surgery   RIGHT BREAST REDUCTION LUMPECTOMY WITH RADIOACTIVE SEED and RIGHT SENTINEL LYMPH NODE BIOPSY by Dr Marlou Starks   MAMMARY REDUCTION  (BREAST) ONCOPLASTIC REDUCTION by Dr Marla Roe    03/07/2020 Pathology Results   FINAL MICROSCOPIC DIAGNOSIS:   A. BREAST, RIGHT, LUMPECTOMY:  -  Ductal carcinoma in situ, low grade, 3.4 cm  -  Ductal carcinoma involves multiple papillary lesions  -  Margins uninvolved by carcinoma (<0.1 cm; anterior margin)  -  Previous biopsy site changes  -  See oncology table and comment below   B. LYMPH  NODE, RIGHT AXILLARY, SENTINEL, EXCISION:  -  No carcinoma identified in one lymph node (0/1)   C. LYMPH NODE, RIGHT AXILLARY, SENTINEL, EXCISION:  -  No carcinoma identified in one lymph node (0/1)   D. BREAST, LEFT, MAMMOPLASTY:  -  Usual ductal hyperplasia and fibrocystic changes with apocrine  metaplasia  -  Fibroadenomatoid nodule  -  No carcinoma identified  -  See comment   E. BREAST, RIGHT, MAMMOPLASTY:  -  Adenosis and fibrocystic changes with apocrine metaplasia  -  No carcinoma identified   F. BREAST, RIGHT ADDITIONAL INFERIOR MARGIN, EXCISION:  -  Atypical ductal hyperplasia involving intraductal papillomas  -  Lobular neoplasm (atypical lobular hyperplasia  -  See comment   COMMENT:   A.  The resection specimen has multiple papillary lesions, several of  which are involved by ductal carcinoma in situ.  Immunohistochemistry  (SMM, calponin, p63 and cytokeratin 5/6) was utilized to assess for an  invasive component and atypia at the inked resection margins.  Cytokeratin 5/6 is negative and foci of ductal carcinoma in situ.  Myoepithelial markers are retained supporting the absence of an invasive  component.   D.  Cytokeratin 5/6 is positive and/or has mosaic pattern expression and  foci of usual ductal hyperplasia.   E.  AMENDMENT NOTE: DIAGNOSTIC INFORMATION HAS NOT BEEN CHANGED. The  original report had a typographical error in part E.  The letter "F" was  inserted prior to the diagnosis.  This has been removed.   F. Cytokeratin 5/6 is negative and foci of atypical ductal hyperplasia.  Myoepithelial markers (SMM, p63 and calponin) are retained supporting  the absence of an invasive component.  E-cadherin is negative in the  foci of lobular neoplasia.    03/07/2020 Cancer Staging   Staging form: Breast, AJCC 8th Edition - Pathologic stage from 03/07/2020: Stage 0 (pTis (DCIS), pN0, cM0, G1, ER+, PR+, HER2: Not Assessed) - Signed by Alla Feeling, NP on  08/27/2020 Histologic grading system: 3 grade system   04/16/2020 - 06/05/2020 Radiation Therapy   Adjuvant Radiation with Dr Lisbeth Renshaw    08/27/2020 -  Anti-estrogen oral therapy   Tamoxifen 20 mg po once daily   08/27/2020 Survivorship   SCP delivered by Ariel Rue, NP      CURRENT THERAPY: Surveillance   INTERVAL HISTORY Ariel Andersen returns for follow up as scheduled. Last seen by Dr. Burr Medico 07/03/21. Mammogram 02/07/22 showed breast density cat C, negative for malignancy. Screening MRI 03/31/22 also negative.   ROS   Past Medical History:  Diagnosis Date   Arthritis of knee, left    Breast cancer ([place])    Family history of pancreatic cancer 01/12/2020   Hypertension    Pre-diabetes    per patient   Seasonal allergies      Past Surgical History:  Procedure Laterality Date   BACK SURGERY  2017   BREAST LUMPECTOMY WITH RADIOACTIVE SEED AND SENTINEL LYMPH NODE BIOPSY Right 03/07/2020   Procedure: RIGHT BREAST REDUCTION LUMPECTOMY WITH RADIOACTIVE SEED;  Surgeon: Jovita Kussmaul, MD;  Location: [place];  Service: General;  Laterality: Right;   BREAST REDUCTION SURGERY Bilateral 03/07/2020   Procedure: MAMMARY REDUCTION  (BREAST) ONCOPLASTIC REDUCTION;  Surgeon: Wallace Going, DO;  Location: [place];  Service: Plastics;  Laterality: Bilateral;   SENTINEL NODE BIOPSY Right 03/07/2020   Procedure: RIGHT SENTINEL LYMPH NODE BIOPSY;  Surgeon: Jovita Kussmaul, MD;  Location: [place];  Service: General;  Laterality: Right;     Outpatient Encounter Medications as of 07/09/2022  Medication Sig   amLODipine (NORVASC) 5 MG tablet Take 5 mg by mouth daily.   aspirin 81 MG EC tablet Take 81 mg by mouth daily.    atorvastatin (LIPITOR) 10 MG tablet Take 10 mg by mouth daily.   Biotin 10000 MCG TABS Take 10,000 mcg by mouth daily.    Blood Glucose Monitoring Suppl (CONTOUR NEXT ONE) KIT See admin instructions.   CONTOUR NEXT TEST test strip daily.   FARXIGA 10 MG TABS tablet Take 10 mg by mouth  daily.   ferrous sulfate 325 (65 FE) MG EC tablet Take 325 mg by mouth daily.    fluocinolone (SYNALAR) 0.01 % external solution Apply 1 application topically daily as needed (scalp irritation).   losartan-hydrochlorothiazide (HYZAAR) 50-12.5 MG tablet Take 1 tablet by mouth daily.   metFORMIN (GLUCOPHAGE-XR) 500 MG 24 hr tablet SMARTSIG:1 Tablet(s) By Mouth Every Evening   Microlet Lancets MISC daily.   Multiple Vitamins-Minerals (MULTIVITAMIN WOMEN PO) Take 1 tablet by mouth daily.   OZEMPIC, 0.25 OR 0.5 MG/DOSE, 2 MG/3ML SOPN Inject into the skin.   potassium chloride SA (KLOR-CON) 20 MEQ tablet Take 1 tablet (20 mEq total) by mouth 2 (two) times daily.   triamcinolone cream (KENALOG) 0.1 % Apply 1 application topically daily as needed (eczema).    No facility-administered encounter medications on file as of 07/09/2022.     There were no vitals filed for this visit. There is no height or weight on file to calculate BMI.   PHYSICAL EXAM GENERAL:alert, no  distress and comfortable SKIN: no rash  EYES: sclera clear NECK: without mass LYMPH:  no palpable cervical or supraclavicular lymphadenopathy  LUNGS: clear with normal breathing effort HEART: regular rate & rhythm, no lower extremity edema ABDOMEN: abdomen soft, non-tender and normal bowel sounds NEURO: alert & oriented x 3 with fluent speech, no focal motor/sensory deficits Breast exam:  PAC without erythema    CBC    Component Value Date/Time   WBC 4.1 04/05/2021 1442   WBC 5.2 03/01/2020 0957   RBC 4.68 04/05/2021 1442   HGB 11.9 (L) 04/05/2021 1442   HCT 37.7 04/05/2021 1442   PLT 200 04/05/2021 1442   MCV 80.6 04/05/2021 1442   MCH 25.4 (L) 04/05/2021 1442   MCHC 31.6 04/05/2021 1442   RDW 14.0 04/05/2021 1442   LYMPHSABS 1.3 04/05/2021 1442   MONOABS 0.3 04/05/2021 1442   EOSABS 0.1 04/05/2021 1442   BASOSABS 0.0 04/05/2021 1442     CMP     Component Value Date/Time   NA 140 04/05/2021 1442   K 2.9 (L)  04/05/2021 1442   CL 100 04/05/2021 1442   CO2 29 04/05/2021 1442   GLUCOSE 188 (H) 04/05/2021 1442   BUN 12 04/05/2021 1442   CREATININE 0.82 04/05/2021 1442   CALCIUM 9.4 04/05/2021 1442   PROT 6.9 04/05/2021 1442   ALBUMIN 3.7 04/05/2021 1442   AST 33 04/05/2021 1442   ALT 31 04/05/2021 1442   ALKPHOS 55 04/05/2021 1442   BILITOT 0.5 04/05/2021 1442   GFRNONAA >60 04/05/2021 1442   GFRAA >60 01/11/2020 1242     ASSESSMENT & PLAN:  PLAN:  No orders of the defined types were placed in this encounter.     All questions were answered. The patient knows to call the clinic with any problems, questions or concerns. No barriers to learning were detected. I spent *** counseling the patient face to face. The total time spent in the appointment was *** and more than 50% was on counseling, review of test results, and coordination of care.   Ariel Rue, NP-C @DATE$ @

## 2022-07-09 ENCOUNTER — Encounter: Payer: Self-pay | Admitting: Nurse Practitioner

## 2022-07-09 ENCOUNTER — Inpatient Hospital Stay: Payer: BC Managed Care – PPO | Attending: Nurse Practitioner | Admitting: Nurse Practitioner

## 2022-07-09 VITALS — BP 132/81 | HR 71 | Temp 97.7°F | Resp 17 | Ht 64.0 in | Wt 172.1 lb

## 2022-07-09 DIAGNOSIS — Z923 Personal history of irradiation: Secondary | ICD-10-CM | POA: Insufficient documentation

## 2022-07-09 DIAGNOSIS — Z800 Family history of malignant neoplasm of digestive organs: Secondary | ICD-10-CM | POA: Diagnosis not present

## 2022-07-09 DIAGNOSIS — Z79810 Long term (current) use of selective estrogen receptor modulators (SERMs): Secondary | ICD-10-CM | POA: Insufficient documentation

## 2022-07-09 DIAGNOSIS — Z170 Estrogen receptor positive status [ER+]: Secondary | ICD-10-CM | POA: Diagnosis not present

## 2022-07-09 DIAGNOSIS — D0511 Intraductal carcinoma in situ of right breast: Secondary | ICD-10-CM | POA: Diagnosis not present

## 2022-08-06 ENCOUNTER — Ambulatory Visit
Admission: RE | Admit: 2022-08-06 | Discharge: 2022-08-06 | Disposition: A | Discharge status: Home / Self Care | Payer: BC Managed Care – PPO | Source: Ambulatory Visit | Attending: Family Medicine | Admitting: Family Medicine

## 2022-08-06 DIAGNOSIS — E049 Nontoxic goiter, unspecified: Secondary | ICD-10-CM

## 2022-08-19 ENCOUNTER — Telehealth: Payer: Self-pay | Admitting: Plastic Surgery

## 2022-08-19 NOTE — Telephone Encounter (Signed)
Pt called back in today to move forward with scheduling her surgery.  No route had been sent to schedule at this time.  Would like a call back to see if she could get scheduled this month.

## 2022-08-22 ENCOUNTER — Encounter: Payer: Self-pay | Admitting: Plastic Surgery

## 2022-08-22 ENCOUNTER — Ambulatory Visit: Payer: BC Managed Care – PPO | Admitting: Plastic Surgery

## 2022-08-22 VITALS — BP 128/75 | HR 74

## 2022-08-22 DIAGNOSIS — Z923 Personal history of irradiation: Secondary | ICD-10-CM | POA: Diagnosis not present

## 2022-08-22 DIAGNOSIS — D0511 Intraductal carcinoma in situ of right breast: Secondary | ICD-10-CM

## 2022-08-22 DIAGNOSIS — N651 Disproportion of reconstructed breast: Secondary | ICD-10-CM

## 2022-08-22 NOTE — Progress Notes (Signed)
   Subjective:    Patient ID: Ariel Andersen, female    DOB: Dec 04, 1965, 57 y.o.   MRN: [id number]  The patient is a 57 year old female here for evaluation of her breasts.  She originally was diagnosed with breast cancer in 2021 on the right breast.  It was ductal carcinoma in situ and she was a 33 D bra.  She had grade 3 ptosis at the time.  She underwent oncoplastic breast reduction with over 600 g removed from the left breast.  The right breast had the general surgery excision and then we removed 174 g.  She went on to get radiation.  I have seen her a couple of times to the tune of about once a year.  She is doing better.  She still has some firmness on the right side from the radiation but it is not painful.  She does not seem to have any skin breakdown or any 1 specific area of firmness.  It is mostly in the medial superior aspect of the right breast.  She had a mammogram last year in September and it was negative.      Review of Systems  Constitutional: Negative.   HENT: Negative.    Eyes: Negative.   Respiratory: Negative.    Cardiovascular: Negative.   Gastrointestinal: Negative.   Endocrine: Negative.   Genitourinary: Negative.        Objective:   Physical Exam Vitals and nursing note reviewed.  Constitutional:      Appearance: Normal appearance.  HENT:     Head: Normocephalic and atraumatic.  Cardiovascular:     Rate and Rhythm: Normal rate.     Pulses: Normal pulses.  Pulmonary:     Effort: Pulmonary effort is normal.  Skin:    Capillary Refill: Capillary refill takes less than 2 seconds.  Neurological:     Mental Status: She is alert and oriented to person, place, and time.  Psychiatric:        Mood and Affect: Mood normal.        Behavior: Behavior normal.        Thought Content: Thought content normal.        Judgment: Judgment normal.         Assessment & Plan:     ICD-10-CM   1. Breast asymmetry following reconstructive surgery  N65.1     2.  Ductal carcinoma in situ (DCIS) of right breast  D05.11        Continue to monitor and follow up in 1 year if it gets worse we will certainly consider surgery.  Continue massaging as well.

## 2023-01-12 ENCOUNTER — Ambulatory Visit: Payer: BC Managed Care – PPO | Attending: General Surgery

## 2023-01-12 VITALS — Wt 181.2 lb

## 2023-01-12 DIAGNOSIS — Z483 Aftercare following surgery for neoplasm: Secondary | ICD-10-CM | POA: Insufficient documentation

## 2023-01-12 NOTE — Therapy (Signed)
  OUTPATIENT PHYSICAL THERAPY SOZO SCREENING NOTE   Patient Name: Ariel Andersen MRN: [id number] DOB:[birth year], 57 y.o., female Today's Date: 01/12/2023  PCP: Laurann Montana, MD REFERRING PROVIDER: Griselda Miner, MD   PT End of Session - 01/12/23 1609     Visit Number 3   # unchanged due to screen only   PT Start Time 1607    PT Stop Time 1611    PT Time Calculation (min) 4 min    Activity Tolerance Patient tolerated treatment well    Behavior During Therapy [facility] for tasks assessed/performed             Past Medical History:  Diagnosis Date   Arthritis of knee, left    Breast cancer ([facility])    Family history of pancreatic cancer 01/12/2020   Hypertension    Pre-diabetes    per patient   Seasonal allergies    Past Surgical History:  Procedure Laterality Date   BACK SURGERY  2017   BREAST LUMPECTOMY WITH RADIOACTIVE SEED AND SENTINEL LYMPH NODE BIOPSY Right 03/07/2020   Procedure: RIGHT BREAST REDUCTION LUMPECTOMY WITH RADIOACTIVE SEED;  Surgeon: Griselda Miner, MD;  Location[facility];  Service: General;  Laterality: Right;   BREAST REDUCTION SURGERY Bilateral 03/07/2020   Procedure: MAMMARY REDUCTION  (BREAST) ONCOPLASTIC REDUCTION;  Surgeon: Peggye Form, DO;  Location: [facility] OR;  Service: Plastics;  Laterality: Bilateral;   SENTINEL NODE BIOPSY Right 03/07/2020   Procedure: RIGHT SENTINEL LYMPH NODE BIOPSY;  Surgeon: Griselda Miner, MD;  Location: [facility] OR;  Service: General;  Laterality: Right;   Patient Active Problem List   Diagnosis Date Noted   Breast asymmetry following reconstructive surgery 12/07/2020   History of breast cancer 12/07/2020   Family history of pancreatic cancer 01/12/2020   Ductal carcinoma in situ (DCIS) of right breast 01/06/2020    REFERRING DIAG: right breast cancer at risk for lymphedema  THERAPY DIAG: Aftercare following surgery for neoplasm  PERTINENT HISTORY: Patient was diagnosed on 12/05/2019 with right intermediate grade DCIS.  She underwent a right lumpectomy and sentinel node biopsy on 03/07/2020 with 2 negative nodes removed and a bilateral breast reduction. It is ER/PR positive.   PRECAUTIONS: right UE Lymphedema risk, None  SUBJECTIVE: Pt returns for her 6 month L-Dex screen.  PAIN:  Are you having pain? No  SOZO SCREENING: Patient was assessed today using the SOZO machine to determine the lymphedema index score. This was compared to her baseline score. It was determined that she is within the recommended range when compared to her baseline and no further action is needed at this time. She will continue SOZO screenings. These are done every 3 months for 2 years post operatively followed by every 6 months for 2 years, and then annually.   L-DEX FLOWSHEETS - 01/12/23 1600       L-DEX LYMPHEDEMA SCREENING   Measurement Type Unilateral    L-DEX MEASUREMENT EXTREMITY Upper Extremity    POSITION  Standing    DOMINANT SIDE Right    At Risk Side Right    BASELINE SCORE (UNILATERAL) -5.9    L-DEX SCORE (UNILATERAL) -2.2    VALUE CHANGE (UNILAT) 3.7              Hermenia Bers, PTA 01/12/2023, 4:11 PM

## 2023-06-24 ENCOUNTER — Telehealth: Payer: Self-pay | Admitting: Hematology

## 2023-06-24 NOTE — Telephone Encounter (Signed)
 Rescheduled appointments per provider request. Patient is aware of the made appointments and will be mailed an appointment reminder.

## 2023-07-13 ENCOUNTER — Ambulatory Visit: Payer: 59 | Attending: General Surgery | Admitting: Physical Therapy

## 2023-07-13 DIAGNOSIS — Z483 Aftercare following surgery for neoplasm: Secondary | ICD-10-CM | POA: Insufficient documentation

## 2023-07-13 NOTE — Therapy (Signed)
  OUTPATIENT PHYSICAL THERAPY SOZO SCREENING NOTE   Patient Name: Ariel Andersen MRN: [id number] DOB:1965-10-22, 58 y.o., female Today's Date: 07/13/2023  PCP: Laurann Montana, MD REFERRING PROVIDER: Griselda Miner, MD   PT End of Session - 07/13/23 1603     Visit Number 3   unchanged due to screen only   PT Start Time 1603    PT Stop Time 1606    PT Time Calculation (min) 3 min             Past Medical History:  Diagnosis Date   Arthritis of knee, left    Breast cancer ([facility])    Family history of pancreatic cancer 01/12/2020   Hypertension    Pre-diabetes    per patient   Seasonal allergies    Past Surgical History:  Procedure Laterality Date   BACK SURGERY  2017   BREAST LUMPECTOMY WITH RADIOACTIVE SEED AND SENTINEL LYMPH NODE BIOPSY Right 03/07/2020   Procedure: RIGHT BREAST REDUCTION LUMPECTOMY WITH RADIOACTIVE SEED;  Surgeon: Griselda Miner, MD;  Location: [facility] OR;  Service: General;  Laterality: Right;   BREAST REDUCTION SURGERY Bilateral 03/07/2020   Procedure: MAMMARY REDUCTION  (BREAST) ONCOPLASTIC REDUCTION;  Surgeon: Peggye Form, DO;  Location: [facility] OR;  Service: Plastics;  Laterality: Bilateral;   SENTINEL NODE BIOPSY Right 03/07/2020   Procedure: RIGHT SENTINEL LYMPH NODE BIOPSY;  Surgeon: Griselda Miner, MD;  Location: [facility] OR;  Service: General;  Laterality: Right;   Patient Active Problem List   Diagnosis Date Noted   Breast asymmetry following reconstructive surgery 12/07/2020   History of breast cancer 12/07/2020   Family history of pancreatic cancer 01/12/2020   Ductal carcinoma in situ (DCIS) of right breast 01/06/2020    REFERRING DIAG: right breast cancer at risk for lymphedema  THERAPY DIAG: Aftercare following surgery for neoplasm  PERTINENT HISTORY: Patient was diagnosed on 12/05/2019 with right intermediate grade DCIS. She underwent a right lumpectomy and sentinel node biopsy on 03/07/2020 with 2 negative nodes removed and a bilateral  breast reduction. It is ER/PR positive.   PRECAUTIONS: right UE Lymphedema risk, None  SUBJECTIVE: Pt returns for her 6 month L-Dex screen.  PAIN:  Are you having pain? No  SOZO SCREENING: Patient was assessed today using the SOZO machine to determine the lymphedema index score. This was compared to her baseline score. It was determined that she is within the recommended range when compared to her baseline and no further action is needed at this time. She will continue SOZO screenings. These are done every 3 months for 2 years post operatively followed by every 6 months for 2 years, and then annually.   L-DEX FLOWSHEETS - 07/13/23 1600       L-DEX LYMPHEDEMA SCREENING   Measurement Type Unilateral    L-DEX MEASUREMENT EXTREMITY Upper Extremity    POSITION  Standing    DOMINANT SIDE Right    At Risk Side Right    BASELINE SCORE (UNILATERAL) -5.9    L-DEX SCORE (UNILATERAL) -0.1    VALUE CHANGE (UNILAT) 5.8             P:repeat 3 months due to increase and then return to 6 months  [employer], PT 07/13/2023, 4:07 PM

## 2023-07-14 NOTE — Assessment & Plan Note (Addendum)
 Intermediate grade II, ER+/PR+, ADH, LDH  -Diagnosed in 12/2019 with 4cm DCIS in right breast, f/p right lumpectomy and SLNB by Dr Carolynne Edouard on 03/07/20 and adjuvant Radiation with Dr Mitzi Hansen 04/16/20-06/05/20.  -she began tamoxifen 08/27/20, unfortunately she did not tolerate full or low dose due to severe hot flashes and stopped in 04/2021 -Continue surveillance. She agrees to continue annual mammogram but would like to do MRIs every 2 years

## 2023-07-15 ENCOUNTER — Inpatient Hospital Stay: Payer: Self-pay | Attending: Hematology | Admitting: Hematology

## 2023-07-15 ENCOUNTER — Encounter: Payer: Self-pay | Admitting: Hematology

## 2023-07-15 ENCOUNTER — Ambulatory Visit: Payer: BC Managed Care – PPO | Admitting: Nurse Practitioner

## 2023-07-15 VITALS — BP 129/85 | HR 75 | Temp 98.0°F | Resp 16 | Wt 179.7 lb

## 2023-07-15 DIAGNOSIS — Z86000 Personal history of in-situ neoplasm of breast: Secondary | ICD-10-CM | POA: Insufficient documentation

## 2023-07-15 DIAGNOSIS — F419 Anxiety disorder, unspecified: Secondary | ICD-10-CM | POA: Diagnosis not present

## 2023-07-15 DIAGNOSIS — F32A Depression, unspecified: Secondary | ICD-10-CM | POA: Diagnosis not present

## 2023-07-15 DIAGNOSIS — Z800 Family history of malignant neoplasm of digestive organs: Secondary | ICD-10-CM | POA: Diagnosis not present

## 2023-07-15 DIAGNOSIS — Z923 Personal history of irradiation: Secondary | ICD-10-CM | POA: Insufficient documentation

## 2023-07-15 DIAGNOSIS — D0511 Intraductal carcinoma in situ of right breast: Secondary | ICD-10-CM

## 2023-07-15 NOTE — Progress Notes (Signed)
 [facility]   [phone] [phone]   Clinic Follow up Note   Patient Care Team: Laurann Montana, MD as PCP - General (Family Medicine) Donnelly Angelica, RN as Oncology Nurse Navigator Pershing Proud, RN as Oncology Nurse Navigator Malachy Mood, MD as Consulting Physician (Hematology) Griselda Miner, MD as Consulting Physician (General Surgery) Dorothy Puffer, MD as Consulting Physician (Radiation Oncology) Pollyann Samples, NP as Nurse Practitioner (Nurse Practitioner)  Date of Service:  07/15/2023  CHIEF COMPLAINT: f/u of right breast DCIS  CURRENT THERAPY:  Cancer surveillance  Oncology History   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  -she began tamoxifen 08/27/20, unfortunately she did not tolerate full or low dose due to severe hot flashes and stopped in 04/2021 -Continue surveillance. She agrees to continue annual mammogram but would like to do MRIs every 2 years    Assessment and Plan    Ductal Carcinoma In Situ (DCIS) 58 year old female with stage 0 DCIS, previously treated with radiation and tamoxifen (discontinued due to side effects). Last mammogram in July 2024 and breast MRI in November 2023 were normal. High risk for future breast cancer necessitates intensive screening. Discussed annual mammograms and biennial breast MRIs. Patient prefers biennial MRI due to previous normal results. - Order breast MRI with and without contrast for November 2025 - Order annual mammogram at [facility] - Encourage self-breast exams every few months  Anxiety and Depression Experiencing anxiety and depression related to job stress, leading to resignation. Currently on medication for anxiety with improved condition. - Continue current anxiety  medication  General Health Maintenance Advised to continue regular follow-ups with primary care physician and gynecologist. - Follow up with primary care physician and gynecologist as needed.      Plan -She is clinically doing well, breast exam is unremarkable, no clinical concern for breast cancer -she will follow-up with her PCP and GYN for breast cancer surveillance -I will see her as needed   SUMMARY OF ONCOLOGIC HISTORY: Oncology History Overview Note  Cancer Staging Ductal carcinoma in situ (DCIS) of right breast Staging form: Breast, AJCC 8th Edition - Clinical stage from 01/11/2020: Stage 0 (cTis (DCIS), cN0, cM0, ER+, PR+) - Unsigned    Ductal carcinoma in situ (DCIS) of right breast  12/05/2019 Mammogram   Diagnostic Mammogram 12/05/19  IMPRESSION The 4cm span of segmental pleomorphic calcifications in the right breast lower inner aspect middle depth 7cm from the nipple are suspicious of malignancy.   01/04/2020 Initial Biopsy   Diagnosis  Breast, right, needle core biopsy, RLMQ, middle depth, 7cmfn -DUCTAL CARCINOMA IN SITU INVOLVING A PAPILLARY LESION -USUAL DUCTAL HYPERPLASIA AND COLUMNAR CELL CHANGE -SEE COMMENT   01/04/2020 Receptors her2   ER - 95% positive moderate staining  PR - 99% positive strong staining     01/06/2020 Initial Diagnosis   Ductal carcinoma in situ (DCIS) of right breast   03/07/2020 Surgery   RIGHT BREAST REDUCTION LUMPECTOMY WITH RADIOACTIVE SEED and RIGHT SENTINEL LYMPH NODE BIOPSY by Dr Carolynne Edouard   MAMMARY REDUCTION  (BREAST) ONCOPLASTIC REDUCTION by Dr Ulice Bold    03/07/2020 Pathology Results   FINAL MICROSCOPIC DIAGNOSIS:   A. BREAST, RIGHT, LUMPECTOMY:  -  Ductal carcinoma in situ, low grade, 3.4 cm  -  Ductal carcinoma involves multiple papillary  lesions  -  Margins uninvolved by carcinoma (<0.1 cm; anterior margin)  -  Previous biopsy site changes  -  See oncology table and comment below   B. LYMPH NODE, RIGHT AXILLARY,  SENTINEL, EXCISION:  -  No carcinoma identified in one lymph node (0/1)   C. LYMPH NODE, RIGHT AXILLARY, SENTINEL, EXCISION:  -  No carcinoma identified in one lymph node (0/1)   D. BREAST, LEFT, MAMMOPLASTY:  -  Usual ductal hyperplasia and fibrocystic changes with apocrine  metaplasia  -  Fibroadenomatoid nodule  -  No carcinoma identified  -  See comment   E. BREAST, RIGHT, MAMMOPLASTY:  -  Adenosis and fibrocystic changes with apocrine metaplasia  -  No carcinoma identified   F. BREAST, RIGHT ADDITIONAL INFERIOR MARGIN, EXCISION:  -  Atypical ductal hyperplasia involving intraductal papillomas  -  Lobular neoplasm (atypical lobular hyperplasia  -  See comment   COMMENT:   A.  The resection specimen has multiple papillary lesions, several of  which are involved by ductal carcinoma in situ.  Immunohistochemistry  (SMM, calponin, p63 and cytokeratin 5/6) was utilized to assess for an  invasive component and atypia at the inked resection margins.  Cytokeratin 5/6 is negative and foci of ductal carcinoma in situ.  Myoepithelial markers are retained supporting the absence of an invasive  component.   D.  Cytokeratin 5/6 is positive and/or has mosaic pattern expression and  foci of usual ductal hyperplasia.   E.  AMENDMENT NOTE: DIAGNOSTIC INFORMATION HAS NOT BEEN CHANGED. The  original report had a typographical error in part E.  The letter "F" was  inserted prior to the diagnosis.  This has been removed.   F. Cytokeratin 5/6 is negative and foci of atypical ductal hyperplasia.  Myoepithelial markers (SMM, p63 and calponin) are retained supporting  the absence of an invasive component.  E-cadherin is negative in the  foci of lobular neoplasia.    03/07/2020 Cancer Staging   Staging form: Breast, AJCC 8th Edition - Pathologic stage from 03/07/2020: Stage 0 (pTis (DCIS), pN0, cM0, G1, ER+, PR+, HER2: Not Assessed) - Signed by Pollyann Samples, NP on 08/27/2020 Histologic  grading system: 3 grade system   04/16/2020 - 06/05/2020 Radiation Therapy   Adjuvant Radiation with Dr Mitzi Hansen    08/27/2020 -  Anti-estrogen oral therapy   Tamoxifen 20 mg po once daily   08/27/2020 Survivorship   SCP delivered by Santiago Glad, NP      Discussed the use of AI scribe software for clinical note transcription with the patient, who gave verbal consent to proceed.  History of Present Illness   The patient, a 58 year old female with a history of stage zero DCIS breast cancer, presents for her annual follow-up. Over the past year, she experienced anxiety and depression related to her job, leading to her resignation. She sought help for these issues and was prescribed medication for anxiety, which she continues to take. She reports feeling better since addressing these mental health concerns.  Regarding her breast cancer, she has no current concerns. She previously tried tamoxifen but discontinued due to severe hot flashes. She has been adhering to recommended screenings, including annual mammograms and biennial breast MRIs. Her last mammogram was in July of the previous year, and her last MRI was in November two years ago, both of which were normal.  In addition to her breast cancer history, she also had a thyroid issue that required an MRI. The thyroid was found to  be slightly inflamed, but no further issues were reported. She also had a breast reduction in the past.         All other systems were reviewed with the patient and are negative.  MEDICAL HISTORY:  Past Medical History:  Diagnosis Date   Arthritis of knee, left    Breast cancer (HCC)    Family history of pancreatic cancer 01/12/2020   Hypertension    Pre-diabetes    per patient   Seasonal allergies     SURGICAL HISTORY: Past Surgical History:  Procedure Laterality Date   BACK SURGERY  2017   BREAST LUMPECTOMY WITH RADIOACTIVE SEED AND SENTINEL LYMPH NODE BIOPSY Right 03/07/2020   Procedure: RIGHT BREAST  REDUCTION LUMPECTOMY WITH RADIOACTIVE SEED;  Surgeon: Griselda Miner, MD;  Location: [facility] OR;  Service: General;  Laterality: Right;   BREAST REDUCTION SURGERY Bilateral 03/07/2020   Procedure: MAMMARY REDUCTION  (BREAST) ONCOPLASTIC REDUCTION;  Surgeon: Peggye Form, DO;  Location: [facility] OR;  Service: Plastics;  Laterality: Bilateral;   SENTINEL NODE BIOPSY Right 03/07/2020   Procedure: RIGHT SENTINEL LYMPH NODE BIOPSY;  Surgeon: Griselda Miner, MD;  Location: [facility] OR;  Service: General;  Laterality: Right;    I have reviewed the social history and family history with the patient and they are unchanged from previous note.  ALLERGIES:  has no known allergies.  MEDICATIONS:  Current Outpatient Medications  Medication Sig Dispense Refill   amLODipine (NORVASC) 5 MG tablet Take 5 mg by mouth daily.     aspirin 81 MG EC tablet Take 81 mg by mouth daily.      atorvastatin (LIPITOR) 10 MG tablet Take 10 mg by mouth daily.     FARXIGA 10 MG TABS tablet Take 10 mg by mouth daily.     ferrous sulfate 325 (65 FE) MG EC tablet Take 325 mg by mouth daily.      fluocinolone (SYNALAR) 0.01 % external solution Apply 1 application topically daily as needed (scalp irritation).     losartan-hydrochlorothiazide (HYZAAR) 50-12.5 MG tablet Take 1 tablet by mouth daily.     metFORMIN (GLUCOPHAGE-XR) 500 MG 24 hr tablet SMARTSIG:1 Tablet(s) By Mouth Every Evening     Multiple Vitamins-Minerals (MULTIVITAMIN WOMEN PO) Take 1 tablet by mouth daily.     OZEMPIC, 0.25 OR 0.5 MG/DOSE, 2 MG/3ML SOPN Inject into the skin.     triamcinolone cream (KENALOG) 0.1 % Apply 1 application topically daily as needed (eczema).      No current facility-administered medications for this visit.    PHYSICAL EXAMINATION: ECOG PERFORMANCE STATUS: 0 - Asymptomatic  Vitals:   07/15/23 1135  BP: 129/85  Pulse: 75  Resp: 16  Temp: 98 F (36.7 C)  SpO2: 98%   Wt Readings from Last 3 Encounters:  07/15/23 179 lb 11.2 oz (81.5  kg)  01/12/23 181 lb 4 oz (82.2 kg)  07/09/22 172 lb 1.6 oz (78.1 kg)     GENERAL:alert, no distress and comfortable SKIN: skin color, texture, turgor are normal, no rashes or significant lesions EYES: normal, Conjunctiva are pink and non-injected, sclera clear NECK: supple, thyroid normal size, non-tender, without nodularity LYMPH:  no palpable lymphadenopathy in the cervical, axillary  LUNGS: clear to auscultation and percussion with normal breathing effort HEART: regular rate & rhythm and no murmurs and no lower extremity edema ABDOMEN:abdomen soft, non-tender and normal bowel sounds Musculoskeletal:no cyanosis of digits and no clubbing  NEURO: alert & oriented x 3 with fluent speech, no  focal motor/sensory deficits BREAST: Breasts symmetrical, no masses, no tenderness. Incisions around nipples well-healed. Scar tissue in inner right breast, stable, likely related to previous surgery and radiation       LABORATORY DATA:  I have reviewed the data as listed    Latest Ref Rng & Units 04/05/2021    2:42 PM 11/30/2020    2:49 PM 03/01/2020    9:57 AM  CBC  WBC 4.0 - 10.5 K/uL 4.1  4.2  5.2   Hemoglobin 12.0 - 15.0 g/dL 08.6  57.8  46.9   Hematocrit 36.0 - 46.0 % 37.7  40.0  42.7   Platelets 150 - 400 K/uL 200  191  214         Latest Ref Rng & Units 04/05/2021    2:42 PM 11/30/2020    2:49 PM 03/01/2020    9:57 AM  CMP  Glucose 70 - 99 mg/dL 629  528  413   BUN 6 - 20 mg/dL 12  8  7    Creatinine 0.44 - 1.00 mg/dL 2.44  0.10  2.72   Sodium 135 - 145 mmol/L 140  139  140   Potassium 3.5 - 5.1 mmol/L 2.9  3.0  3.2   Chloride 98 - 111 mmol/L 100  99  101   CO2 22 - 32 mmol/L 29  30  31    Calcium 8.9 - 10.3 mg/dL 9.4  9.3  9.5   Total Protein 6.5 - 8.1 g/dL 6.9  7.3    Total Bilirubin 0.3 - 1.2 mg/dL 0.5  0.7    Alkaline Phos 38 - 126 U/L 55  54    AST 15 - 41 U/L 33  35    ALT 0 - 44 U/L 31  31        RADIOGRAPHIC STUDIES: I have personally reviewed the radiological  images as listed and agreed with the findings in the report. No results found.    Orders Placed This Encounter  Procedures   MR BREAST BILATERAL W WO CONTRAST INC CAD    Standing Status:   Future    Expected Date:   04/12/2024    Expiration Date:   07/14/2024    If indicated for the ordered procedure, I authorize the administration of contrast media per Radiology protocol:   Yes    What is the patient's sedation requirement?:   No Sedation    Does the patient have a pacemaker or implanted devices?:   No    Radiology Contrast Protocol - do NOT remove file path:   [email or web address]    Preferred imaging location?:   [facility] (table limit-550lbs)   All questions were answered. The patient knows to call the clinic with any problems, questions or concerns. No barriers to learning was detected. The total time spent in the appointment was 15 minutes.     Malachy Mood, MD 07/15/2023

## 2023-08-21 ENCOUNTER — Ambulatory Visit: Payer: BC Managed Care – PPO | Admitting: Plastic Surgery

## 2023-10-26 ENCOUNTER — Ambulatory Visit: Payer: 59

## 2023-12-14 ENCOUNTER — Ambulatory Visit: Attending: General Surgery

## 2023-12-14 VITALS — Wt 180.2 lb

## 2023-12-14 DIAGNOSIS — Z483 Aftercare following surgery for neoplasm: Secondary | ICD-10-CM | POA: Insufficient documentation

## 2023-12-14 NOTE — Therapy (Signed)
  OUTPATIENT PHYSICAL THERAPY SOZO SCREENING NOTE   Patient Name: Ariel Andersen MRN: [id number] DOB:[birth year], 58 y.o., female Today's Date: 12/14/2023  PCP: Teresa Channel, MD REFERRING PROVIDER: Curvin Deward MOULD, MD   PT End of Session - 12/14/23 1648     Visit Number 3   # unchanged due to screen only   PT Start Time 1646    PT Stop Time 1650    PT Time Calculation (min) 4 min    Activity Tolerance Patient tolerated treatment well    Behavior During Therapy [facility] for tasks assessed/performed          Past Medical History:  Diagnosis Date   Arthritis of knee, left    Breast cancer (HCC)    Family history of pancreatic cancer 01/12/2020   Hypertension    Pre-diabetes    per patient   Seasonal allergies    Past Surgical History:  Procedure Laterality Date   BACK SURGERY  2017   BREAST LUMPECTOMY WITH RADIOACTIVE SEED AND SENTINEL LYMPH NODE BIOPSY Right 03/07/2020   Procedure: RIGHT BREAST REDUCTION LUMPECTOMY WITH RADIOACTIVE SEED;  Surgeon: Curvin Deward MOULD, MD;  Location: [facility] OR;  Service: General;  Laterality: Right;   BREAST REDUCTION SURGERY Bilateral 03/07/2020   Procedure: MAMMARY REDUCTION  (BREAST) ONCOPLASTIC REDUCTION;  Surgeon: Lowery Estefana RAMAN, DO;  Location: [facility] OR;  Service: Plastics;  Laterality: Bilateral;   SENTINEL NODE BIOPSY Right 03/07/2020   Procedure: RIGHT SENTINEL LYMPH NODE BIOPSY;  Surgeon: Curvin Deward MOULD, MD;  Location[facility] OR;  Service: General;  Laterality: Right;   Patient Active Problem List   Diagnosis Date Noted   Breast asymmetry following reconstructive surgery 12/07/2020   History of breast cancer 12/07/2020   Family history of pancreatic cancer 01/12/2020   Ductal carcinoma in situ (DCIS) of right breast 01/06/2020    REFERRING DIAG: right breast cancer at risk for lymphedema  THERAPY DIAG: Aftercare following surgery for neoplasm  PERTINENT HISTORY: Patient was diagnosed on 12/05/2019 with right intermediate grade DCIS. She  underwent a right lumpectomy and sentinel node biopsy on 03/07/2020 with 2 negative nodes removed and a bilateral breast reduction. It is ER/PR positive.   PRECAUTIONS: right UE Lymphedema risk, None  SUBJECTIVE: Pt returns for her SOZO.  PAIN:  Are you having pain? No  SOZO SCREENING: Patient was assessed today using the SOZO machine to determine the lymphedema index score. This was compared to her baseline score. It was determined that she is within the recommended range when compared to her baseline and no further action is needed at this time. She will continue SOZO screenings. These are done every 3 months for 2 years post operatively followed by every 6 months for 2 years, and then annually.   L-DEX FLOWSHEETS - 12/14/23 1600       L-DEX LYMPHEDEMA SCREENING   Measurement Type Unilateral    L-DEX MEASUREMENT EXTREMITY Upper Extremity    POSITION  Standing    DOMINANT SIDE Right    At Risk Side Right    BASELINE SCORE (UNILATERAL) -5.9    L-DEX SCORE (UNILATERAL) -5    VALUE CHANGE (UNILAT) 0.9          P: Pt to transition to annual due to near 4 yr.   Aden Berwyn Caldron, PTA 12/14/2023, 4:50 PM

## 2024-02-23 ENCOUNTER — Encounter: Payer: Self-pay | Admitting: Hematology

## 2024-03-29 ENCOUNTER — Encounter: Payer: Self-pay | Admitting: Hematology

## 2024-04-12 ENCOUNTER — Ambulatory Visit
Admission: RE | Admit: 2024-04-12 | Discharge: 2024-04-12 | Disposition: A | Discharge status: Home / Self Care | Source: Ambulatory Visit | Attending: Hematology | Admitting: Hematology

## 2024-04-12 ENCOUNTER — Telehealth: Payer: Self-pay

## 2024-04-12 ENCOUNTER — Other Ambulatory Visit

## 2024-04-12 DIAGNOSIS — D0511 Intraductal carcinoma in situ of right breast: Secondary | ICD-10-CM

## 2024-04-12 MED ORDER — GADOPICLENOL 0.5 MMOL/ML IV SOLN
8.0000 mL | Freq: Once | INTRAVENOUS | Status: AC | PRN
  Administered 2024-04-12: 8 mL via INTRAVENOUS

## 2024-04-12 NOTE — Telephone Encounter (Signed)
 Notified the pt regarding her Cancer Claim forms being completed. The request that her forms be mail to her home address. No questions or concerns to be noted at this time.

## 2024-04-17 ENCOUNTER — Ambulatory Visit: Payer: Self-pay | Admitting: Hematology

## 2024-04-20 ENCOUNTER — Other Ambulatory Visit: Payer: Self-pay

## 2024-04-20 NOTE — Telephone Encounter (Addendum)
 As per Dr.Feng contacted  pt about the message below pt does want to do MRI guided biopsy will place the order and work on getting appt scheduled      ----- Message from Onita Mattock sent at 04/17/2024  2:07 PM EST ----- Please let pt know the MRI result, and my recommendation of biopsy. If she agrees, please order MRI guided biopsy and schedule it. Thanks   Onita  ----- Message ----- From: Rebecka, Rad Results In Sent: 04/12/2024  10:05 AM EST To: Onita Mattock, MD

## 2024-04-26 ENCOUNTER — Other Ambulatory Visit: Payer: Self-pay | Admitting: Nurse Practitioner

## 2024-04-26 DIAGNOSIS — N6325 Unspecified lump in the left breast, overlapping quadrants: Secondary | ICD-10-CM

## 2024-04-26 DIAGNOSIS — D0511 Intraductal carcinoma in situ of right breast: Secondary | ICD-10-CM

## 2024-04-27 ENCOUNTER — Other Ambulatory Visit: Payer: Self-pay

## 2024-04-27 DIAGNOSIS — N6325 Unspecified lump in the left breast, overlapping quadrants: Secondary | ICD-10-CM

## 2024-05-16 ENCOUNTER — Other Ambulatory Visit

## 2024-05-16 ENCOUNTER — Encounter

## 2024-05-26 ENCOUNTER — Ambulatory Visit
Admission: RE | Admit: 2024-05-26 | Discharge: 2024-05-26 | Disposition: A | Discharge status: Home / Self Care | Source: Ambulatory Visit | Attending: Nurse Practitioner | Admitting: Nurse Practitioner

## 2024-05-26 DIAGNOSIS — D0511 Intraductal carcinoma in situ of right breast: Secondary | ICD-10-CM

## 2024-05-26 DIAGNOSIS — N6325 Unspecified lump in the left breast, overlapping quadrants: Secondary | ICD-10-CM

## 2024-05-26 MED ORDER — GADOPICLENOL 0.5 MMOL/ML IV SOLN
8.0000 mL | Freq: Once | INTRAVENOUS | Status: AC | PRN
  Administered 2024-05-26: 8 mL via INTRAVENOUS

## 2024-05-27 LAB — SURGICAL PATHOLOGY

## 2024-06-01 ENCOUNTER — Other Ambulatory Visit: Payer: Self-pay

## 2024-06-01 ENCOUNTER — Ambulatory Visit: Payer: Self-pay | Admitting: Nurse Practitioner

## 2024-06-06 NOTE — Telephone Encounter (Addendum)
 Called patient to relay the below results as per Lacie Burton NP, patient voiced full understanding and had no further questions.   ----- Message from Lacie Burton, NP sent at 06/01/2024  9:58 AM EST ----- Please let pt know we have reviewed the path which is BENIGN, no cancer, and path/radiology agree. Please resume annual mammograms when due and see us  as needed.   Thanks Lacie NP

## 2024-11-28 ENCOUNTER — Ambulatory Visit

## 2024-12-12 ENCOUNTER — Ambulatory Visit
